# Patient Record
Sex: Female | Born: 1950 | Race: Black or African American | Hispanic: No | Marital: Single | Smoking: Current every day smoker
Health system: Southern US, Community
[De-identification: ages and names within clinical notes are randomized; demographics above are authoritative.]

## PROBLEM LIST (undated history)

## (undated) DIAGNOSIS — I255 Ischemic cardiomyopathy: Secondary | ICD-10-CM

## (undated) DIAGNOSIS — I240 Acute coronary thrombosis not resulting in myocardial infarction: Secondary | ICD-10-CM

## (undated) DIAGNOSIS — G40909 Epilepsy, unspecified, not intractable, without status epilepticus: Secondary | ICD-10-CM

## (undated) DIAGNOSIS — E7800 Pure hypercholesterolemia, unspecified: Secondary | ICD-10-CM

## (undated) DIAGNOSIS — Z720 Tobacco use: Secondary | ICD-10-CM

## (undated) DIAGNOSIS — F250 Schizoaffective disorder, bipolar type: Secondary | ICD-10-CM

## (undated) DIAGNOSIS — I10 Essential (primary) hypertension: Secondary | ICD-10-CM

## (undated) DIAGNOSIS — E119 Type 2 diabetes mellitus without complications: Secondary | ICD-10-CM

## (undated) DIAGNOSIS — R413 Other amnesia: Secondary | ICD-10-CM

## (undated) DIAGNOSIS — F259 Schizoaffective disorder, unspecified: Secondary | ICD-10-CM

---

## 2013-01-27 DIAGNOSIS — N952 Postmenopausal atrophic vaginitis: Secondary | ICD-10-CM | POA: Insufficient documentation

## 2013-04-03 ENCOUNTER — Inpatient Hospital Stay (HOSPITAL_COMMUNITY)
Admission: AD | Admit: 2013-04-03 | Discharge: 2013-04-15 | DRG: 234 | Disposition: A | Discharge status: Home / Self Care | Payer: Medicaid Other | Source: Other Acute Inpatient Hospital | Attending: Surgery | Admitting: Surgery

## 2013-04-03 ENCOUNTER — Encounter (HOSPITAL_COMMUNITY): Payer: Self-pay | Admitting: *Deleted

## 2013-04-03 ENCOUNTER — Encounter (HOSPITAL_COMMUNITY)
Admission: AD | Disposition: A | Discharge status: Home / Self Care | Payer: Self-pay | Source: Other Acute Inpatient Hospital | Attending: Cardiovascular Disease

## 2013-04-03 DIAGNOSIS — I2510 Atherosclerotic heart disease of native coronary artery without angina pectoris: Secondary | ICD-10-CM

## 2013-04-03 DIAGNOSIS — Z7982 Long term (current) use of aspirin: Secondary | ICD-10-CM

## 2013-04-03 DIAGNOSIS — E785 Hyperlipidemia, unspecified: Secondary | ICD-10-CM | POA: Diagnosis present

## 2013-04-03 DIAGNOSIS — E1165 Type 2 diabetes mellitus with hyperglycemia: Secondary | ICD-10-CM | POA: Diagnosis present

## 2013-04-03 DIAGNOSIS — Z9119 Patient's noncompliance with other medical treatment and regimen: Secondary | ICD-10-CM

## 2013-04-03 DIAGNOSIS — I2589 Other forms of chronic ischemic heart disease: Secondary | ICD-10-CM | POA: Diagnosis present

## 2013-04-03 DIAGNOSIS — Z6825 Body mass index (BMI) 25.0-25.9, adult: Secondary | ICD-10-CM

## 2013-04-03 DIAGNOSIS — G40209 Localization-related (focal) (partial) symptomatic epilepsy and epileptic syndromes with complex partial seizures, not intractable, without status epilepticus: Secondary | ICD-10-CM | POA: Diagnosis present

## 2013-04-03 DIAGNOSIS — G40909 Epilepsy, unspecified, not intractable, without status epilepticus: Secondary | ICD-10-CM | POA: Insufficient documentation

## 2013-04-03 DIAGNOSIS — I255 Ischemic cardiomyopathy: Secondary | ICD-10-CM

## 2013-04-03 DIAGNOSIS — D62 Acute posthemorrhagic anemia: Secondary | ICD-10-CM | POA: Diagnosis not present

## 2013-04-03 DIAGNOSIS — I059 Rheumatic mitral valve disease, unspecified: Secondary | ICD-10-CM

## 2013-04-03 DIAGNOSIS — F259 Schizoaffective disorder, unspecified: Secondary | ICD-10-CM | POA: Diagnosis present

## 2013-04-03 DIAGNOSIS — Z951 Presence of aortocoronary bypass graft: Secondary | ICD-10-CM

## 2013-04-03 DIAGNOSIS — Z79899 Other long term (current) drug therapy: Secondary | ICD-10-CM

## 2013-04-03 DIAGNOSIS — I214 Non-ST elevation (NSTEMI) myocardial infarction: Principal | ICD-10-CM | POA: Diagnosis present

## 2013-04-03 DIAGNOSIS — E119 Type 2 diabetes mellitus without complications: Secondary | ICD-10-CM | POA: Insufficient documentation

## 2013-04-03 DIAGNOSIS — IMO0002 Reserved for concepts with insufficient information to code with codable children: Secondary | ICD-10-CM | POA: Diagnosis present

## 2013-04-03 DIAGNOSIS — Z8249 Family history of ischemic heart disease and other diseases of the circulatory system: Secondary | ICD-10-CM

## 2013-04-03 DIAGNOSIS — E876 Hypokalemia: Secondary | ICD-10-CM | POA: Diagnosis not present

## 2013-04-03 DIAGNOSIS — Z91199 Patient's noncompliance with other medical treatment and regimen due to unspecified reason: Secondary | ICD-10-CM

## 2013-04-03 DIAGNOSIS — Z720 Tobacco use: Secondary | ICD-10-CM | POA: Insufficient documentation

## 2013-04-03 DIAGNOSIS — F17200 Nicotine dependence, unspecified, uncomplicated: Secondary | ICD-10-CM | POA: Diagnosis present

## 2013-04-03 DIAGNOSIS — F79 Unspecified intellectual disabilities: Secondary | ICD-10-CM | POA: Diagnosis present

## 2013-04-03 DIAGNOSIS — R079 Chest pain, unspecified: Secondary | ICD-10-CM

## 2013-04-03 HISTORY — DX: Acute coronary thrombosis not resulting in myocardial infarction: I24.0

## 2013-04-03 HISTORY — DX: Tobacco use: Z72.0

## 2013-04-03 HISTORY — DX: Schizoaffective disorder, bipolar type: F25.0

## 2013-04-03 HISTORY — DX: Epilepsy, unspecified, not intractable, without status epilepticus: G40.909

## 2013-04-03 HISTORY — DX: Ischemic cardiomyopathy: I25.5

## 2013-04-03 HISTORY — DX: Other amnesia: R41.3

## 2013-04-03 HISTORY — PX: CORONARY ANGIOGRAM: SHX5466

## 2013-04-03 HISTORY — DX: Schizoaffective disorder, unspecified: F25.9

## 2013-04-03 HISTORY — PX: RIGHT HEART CATHETERIZATION: SHX5447

## 2013-04-03 LAB — BASIC METABOLIC PANEL
BUN: 8 mg/dL (ref 6–23)
BUN: 6 mg/dL (ref 6–23)
CO2: 29 mEq/L (ref 19–32)
Calcium: 9.1 mg/dL (ref 8.4–10.5)
Calcium: 8.8 mg/dL (ref 8.4–10.5)
Creatinine, Ser: 0.46 mg/dL — ABNORMAL LOW (ref 0.50–1.10)
GFR calc Af Amer: >90 mL/min (ref >90)
GFR calc non Af Amer: >90 mL/min (ref >90)
GFR calc non Af Amer: >90 mL/min (ref >90)
Glucose, Bld: 364 mg/dL — ABNORMAL HIGH (ref 70–99)
Potassium: 3 mEq/L — ABNORMAL LOW (ref 3.5–5.1)
Potassium: 3.3 mEq/L — ABNORMAL LOW (ref 3.5–5.1)
Sodium: 134 mEq/L — ABNORMAL LOW (ref 135–145)

## 2013-04-03 LAB — CBC
HCT: 39.2 % (ref 36.0–46.0)
Hemoglobin: 14.1 g/dL (ref 12.0–15.0)
Hemoglobin: 13.2 g/dL (ref 12.0–15.0)
MCH: 32.8 pg (ref 26.0–34.0)
MCH: 31.1 pg (ref 26.0–34.0)
MCHC: 36 g/dL (ref 30.0–36.0)
MCV: 90.6 fL (ref 78.0–100.0)
Platelets: 225 10*3/uL (ref 150–400)
RBC: 4.25 MIL/uL (ref 3.87–5.11)
RDW: 12.4 % (ref 11.5–15.5)
RDW: 12.5 % (ref 11.5–15.5)

## 2013-04-03 LAB — POCT I-STAT 3, VENOUS BLOOD GAS (G3P V)
Acid-base deficit: 1 mmol/L (ref 0.0–2.0)
Bicarbonate: 26.2 mEq/L — ABNORMAL HIGH (ref 20.0–24.0)
O2 Saturation: 67 %
TCO2: 28 mmol/L (ref 0–100)
pCO2, Ven: 45.7 mmHg (ref 45.0–50.0)
pO2, Ven: 36 mmHg (ref 30.0–45.0)
pO2, Ven: 38 mmHg (ref 30.0–45.0)

## 2013-04-03 LAB — LIPID PANEL
Total CHOL/HDL Ratio: 8.6 RATIO
Triglycerides: 299 mg/dL — ABNORMAL HIGH (ref <150)
VLDL: 60 mg/dL — ABNORMAL HIGH (ref 0–40)

## 2013-04-03 LAB — TROPONIN I
Troponin I: 1.61 ng/mL (ref <0.30)
Troponin I: 0.97 ng/mL (ref <0.30)

## 2013-04-03 LAB — POCT ACTIVATED CLOTTING TIME: Activated Clotting Time: 135 seconds

## 2013-04-03 LAB — POCT I-STAT 3, ART BLOOD GAS (G3+)
O2 Saturation: 91 %
pO2, Arterial: 61 mmHg — ABNORMAL LOW (ref 80.0–100.0)

## 2013-04-03 LAB — GLUCOSE, CAPILLARY
Glucose-Capillary: 192 mg/dL — ABNORMAL HIGH (ref 70–99)
Glucose-Capillary: 268 mg/dL — ABNORMAL HIGH (ref 70–99)
Glucose-Capillary: 199 mg/dL — ABNORMAL HIGH (ref 70–99)

## 2013-04-03 LAB — MRSA PCR SCREENING: MRSA by PCR: NEGATIVE

## 2013-04-03 LAB — PRO B NATRIURETIC PEPTIDE: Pro B Natriuretic peptide (BNP): 2068 pg/mL — ABNORMAL HIGH (ref 0–125)

## 2013-04-03 LAB — HEMOGLOBIN A1C
Hgb A1c MFr Bld: 11.9 % — ABNORMAL HIGH (ref <5.7)
Mean Plasma Glucose: 295 mg/dL — ABNORMAL HIGH (ref <117)

## 2013-04-03 LAB — PROTIME-INR: Prothrombin Time: 13.6 seconds (ref 11.6–15.2)

## 2013-04-03 LAB — HEPARIN LEVEL (UNFRACTIONATED): Heparin Unfractionated: 0.23 IU/mL — ABNORMAL LOW (ref 0.30–0.70)

## 2013-04-03 SURGERY — CORONARY ANGIOGRAM

## 2013-04-03 MED ORDER — SODIUM CHLORIDE 0.9 % IJ SOLN
3.0000 mL | Freq: Two times a day (BID) | INTRAMUSCULAR | Status: DC
  Administered 2013-04-04 – 2013-04-06 (×5): 3 mL via INTRAVENOUS
  Administered 2013-04-07: 11:00:00 via INTRAVENOUS
  Administered 2013-04-08: 3 mL via INTRAVENOUS

## 2013-04-03 MED ORDER — HEPARIN (PORCINE) IN NACL 100-0.45 UNIT/ML-% IJ SOLN
1200.0000 [IU]/h | INTRAMUSCULAR | Status: DC
  Administered 2013-04-04: 1000 [IU]/h via INTRAVENOUS
  Administered 2013-04-04 – 2013-04-06 (×3): 1300 [IU]/h via INTRAVENOUS
  Administered 2013-04-07: 1200 [IU]/h via INTRAVENOUS
  Filled 2013-04-03 (×10): qty 250

## 2013-04-03 MED ORDER — NITROGLYCERIN 0.2 MG/ML ON CALL CATH LAB
INTRAVENOUS | Status: AC
  Filled 2013-04-03: qty 1

## 2013-04-03 MED ORDER — ENALAPRIL MALEATE 2.5 MG PO TABS
2.5000 mg | ORAL_TABLET | Freq: Two times a day (BID) | ORAL | Status: DC
  Administered 2013-04-04 (×2): 2.5 mg via ORAL
  Filled 2013-04-03 (×4): qty 1

## 2013-04-03 MED ORDER — ACETAMINOPHEN 325 MG PO TABS
650.0000 mg | ORAL_TABLET | ORAL | Status: DC | PRN
  Administered 2013-04-05 – 2013-04-07 (×3): 650 mg via ORAL
  Filled 2013-04-03 (×3): qty 2

## 2013-04-03 MED ORDER — ACETAMINOPHEN 325 MG PO TABS: 650.0000 mg | ORAL_TABLET | ORAL | Status: DC | PRN

## 2013-04-03 MED ORDER — ATORVASTATIN CALCIUM 80 MG PO TABS
80.0000 mg | ORAL_TABLET | Freq: Every day | ORAL | Status: DC
  Administered 2013-04-04 – 2013-04-13 (×9): 80 mg via ORAL
  Filled 2013-04-03 (×14): qty 1

## 2013-04-03 MED ORDER — INSULIN ASPART 100 UNIT/ML ~~LOC~~ SOLN
5.0000 [IU] | Freq: Once | SUBCUTANEOUS | Status: AC
  Administered 2013-04-03: 04:00:00 5 [IU] via SUBCUTANEOUS

## 2013-04-03 MED ORDER — SODIUM CHLORIDE 0.9 % IV SOLN
INTRAVENOUS | Status: DC
  Administered 2013-04-03 – 2013-04-04 (×2): via INTRAVENOUS
  Administered 2013-04-06: 10 mL/h via INTRAVENOUS
  Administered 2013-04-08: 10:00:00 via INTRAVENOUS
  Administered 2013-04-09: 10 mL/h via INTRAVENOUS

## 2013-04-03 MED ORDER — ONDANSETRON HCL 4 MG/2ML IJ SOLN: 4.0000 mg | Freq: Four times a day (QID) | INTRAMUSCULAR | Status: DC | PRN

## 2013-04-03 MED ORDER — SODIUM CHLORIDE 0.9 % IJ SOLN: 3.0000 mL | INTRAMUSCULAR | Status: DC | PRN

## 2013-04-03 MED ORDER — HEART ATTACK BOUNCING BOOK
Freq: Once | Status: AC
  Administered 2013-04-03: 05:00:00
  Filled 2013-04-03: qty 1

## 2013-04-03 MED ORDER — PHENYTOIN SODIUM EXTENDED 100 MG PO CAPS
100.0000 mg | ORAL_CAPSULE | Freq: Every day | ORAL | Status: DC
  Administered 2013-04-03 – 2013-04-08 (×6): 100 mg via ORAL
  Filled 2013-04-03 (×9): qty 1

## 2013-04-03 MED ORDER — POTASSIUM CHLORIDE CRYS ER 20 MEQ PO TBCR
40.0000 meq | EXTENDED_RELEASE_TABLET | Freq: Once | ORAL | Status: AC
  Administered 2013-04-03: 16:00:00 40 meq via ORAL
  Filled 2013-04-03: qty 2

## 2013-04-03 MED ORDER — SODIUM CHLORIDE 0.9 % IV SOLN: 250.0000 mL | INTRAVENOUS | Status: DC | PRN

## 2013-04-03 MED ORDER — NITROGLYCERIN 0.4 MG SL SUBL: 0.4000 mg | SUBLINGUAL_TABLET | SUBLINGUAL | Status: DC | PRN

## 2013-04-03 MED ORDER — METOPROLOL TARTRATE 12.5 MG HALF TABLET
12.5000 mg | ORAL_TABLET | Freq: Two times a day (BID) | ORAL | Status: DC
  Administered 2013-04-03: 12.5 mg via ORAL
  Filled 2013-04-03 (×3): qty 1

## 2013-04-03 MED ORDER — HEPARIN (PORCINE) IN NACL 100-0.45 UNIT/ML-% IJ SOLN
1100.0000 [IU]/h | INTRAMUSCULAR | Status: DC
  Filled 2013-04-03 (×2): qty 250

## 2013-04-03 MED ORDER — FENTANYL CITRATE 0.05 MG/ML IJ SOLN
INTRAMUSCULAR | Status: AC
  Filled 2013-04-03: qty 2

## 2013-04-03 MED ORDER — PHENYTOIN SODIUM EXTENDED 100 MG PO CAPS
100.0000 mg | ORAL_CAPSULE | Freq: Every day | ORAL | Status: DC
  Filled 2013-04-03: qty 1

## 2013-04-03 MED ORDER — POTASSIUM CHLORIDE CRYS ER 20 MEQ PO TBCR
40.0000 meq | EXTENDED_RELEASE_TABLET | Freq: Once | ORAL | Status: AC
  Administered 2013-04-03: 40 meq via ORAL
  Filled 2013-04-03: qty 2

## 2013-04-03 MED ORDER — LIDOCAINE HCL (PF) 1 % IJ SOLN
INTRAMUSCULAR | Status: AC
  Filled 2013-04-03: qty 30

## 2013-04-03 MED ORDER — SODIUM CHLORIDE 0.9 % IV SOLN
1.0000 mL/kg/h | INTRAVENOUS | Status: DC
  Administered 2013-04-03: 1 mL/kg/h via INTRAVENOUS

## 2013-04-03 MED ORDER — CARVEDILOL 3.125 MG PO TABS
3.1250 mg | ORAL_TABLET | Freq: Two times a day (BID) | ORAL | Status: DC
  Administered 2013-04-04: 3.125 mg via ORAL
  Filled 2013-04-03 (×5): qty 1

## 2013-04-03 MED ORDER — ASPIRIN EC 81 MG PO TBEC
81.0000 mg | DELAYED_RELEASE_TABLET | Freq: Every day | ORAL | Status: DC
  Administered 2013-04-04 – 2013-04-09 (×6): 81 mg via ORAL
  Filled 2013-04-03 (×7): qty 1

## 2013-04-03 MED ORDER — HEPARIN (PORCINE) IN NACL 2-0.9 UNIT/ML-% IJ SOLN
INTRAMUSCULAR | Status: AC
  Filled 2013-04-03: qty 1000

## 2013-04-03 MED ORDER — SODIUM CHLORIDE 0.9 % IV SOLN
INTRAVENOUS | Status: AC
  Administered 2013-04-03: 20:00:00 via INTRAVENOUS

## 2013-04-03 MED ORDER — MIDAZOLAM HCL 2 MG/2ML IJ SOLN
INTRAMUSCULAR | Status: AC
  Filled 2013-04-03: qty 2

## 2013-04-03 MED ORDER — HEPARIN BOLUS VIA INFUSION
1500.0000 [IU] | Freq: Once | INTRAVENOUS | Status: AC
  Administered 2013-04-03: 07:00:00 1500 [IU] via INTRAVENOUS
  Filled 2013-04-03: qty 1500

## 2013-04-03 MED ORDER — SODIUM CHLORIDE 0.9 % IJ SOLN: 3.0000 mL | Freq: Two times a day (BID) | INTRAMUSCULAR | Status: DC

## 2013-04-03 NOTE — Progress Notes (Signed)
   Patient Name: [name] [name] Date of Encounter: 04/03/2013   Principal Problem:   NSTEMI (non-ST elevated myocardial infarction) Active Problems:   T2DM (type 2 diabetes mellitus)   Dyslipidemia   Seizure disorder   SUBJECTIVE  No chest pain or sob overnight.  CURRENT MEDS . [START ON 04/04/2013] aspirin EC  81 mg Oral Daily  . atorvastatin  80 mg Oral q1800  . metoprolol tartrate  12.5 mg Oral BID  . phenytoin  100 mg Oral Daily  . sodium chloride  3 mL Intravenous Q12H    OBJECTIVE  Filed Vitals:   04/03/13 0001 04/03/13 0335 04/03/13 0442 04/03/13 0754  BP: 121/74 98/46 96/59 83/42  Pulse: 80 74 69 76  Temp: 98.2 F (36.8 C)  97.5 F (36.4 C) 97.7 F (36.5 C)  TempSrc: Oral  Oral Oral  Resp: 20  18 18  Height: 5' 1" (1.549 m)     Weight: 135 lb 2.3 oz (61.3 kg)     SpO2: 98%  96% 96%    Intake/Output Summary (Last 24 hours) at 04/03/13 0801 Last data filed at 04/03/13 0641  Gross per 24 hour  Intake    153 ml  Output    300 ml  Net   -147 ml   Filed Weights   04/03/13 0001  Weight: 135 lb 2.3 oz (61.3 kg)    PHYSICAL EXAM  General: Pleasant, NAD. Neuro: Alert and oriented X 3. Moves all extremities spontaneously. Psych: Normal affect. HEENT:  Normal  Neck: Supple without bruits or JVD. Lungs:  Resp regular and unlabored, diminished. Heart: RRR no s3, s4, or murmurs. Abdomen: Soft, non-tender, non-distended, BS + x 4.  Extremities: No clubbing, cyanosis or edema. DP/PT/Radials 2+ and equal bilaterally.  Accessory Clinical Findings  CBC  Recent Labs  04/03/13 0236 04/03/13 0510  WBC 6.4 5.3  HGB 14.1 13.2  HCT 39.2 38.5  MCV 91.2 90.6  PLT 224 225   Basic Metabolic Panel  Recent Labs  04/03/13 0236  NA 134*  K 3.0*  CL 95*  CO2 29  GLUCOSE 364*  BUN 8  CREATININE 0.46*  CALCIUM 9.1  MG 2.2   Cardiac Enzymes  Recent Labs  04/03/13 0236  TROPONINI 1.52*   Fasting Lipid Panel  Recent Labs  04/03/13 0236  CHOL  302*  HDL 35*  LDLCALC 207*  TRIG 299*  CHOLHDL 8.6   TELE  rsr  ECG  pending  Radiology/Studies  No results found.  ASSESSMENT AND PLAN  1.  NSTEMI:  Pt presented with chest pain to [place] [facility] yesterday and was found to have an elevated troponin.  She's had no chest pain overnight.  Cont asa, heparin, bb, statin, and plan cath this afternoon.  She's asked that I call her mother [name] [name], and I called her @ [phone] and discussed our plan for care.  I also called her son [name] [name] @ [phone], however this went directly to voicemail.  2.  DM:  Cont SSI.  3.  HL:  LDL 207.  Cont high potency statin.  4.  Sz d/o:  Cont dilantin.  5.  Tob Abuse:  Cessation advised.  Signed, Christopher Berge NP  Patient examined chart reviewed Pain improved Has significatn memory problems.   Cath latter today  [name]  

## 2013-04-03 NOTE — H&P (Signed)
[name] [name] is an 62 y.o. female.   Chief Complaint: Chest Pain  HPI: [name] [name] is a 62 yo woman with PMH of T2DM, epilepsy, dyslipidemia who has had intermittent chest pain over the last few weeks. She characterizes the pain as substernal, pressure-like, occasional burning, with some radiation to her abdomen, back and jaw. She has some associated SOB and cough. The pain appears to be worse with exertion. She continues to smoke. She is accompanied by her son who tells me she hasn't taken care of herself. She was transferred to [facility] after a troponin resulted at 6.9. ckmb 1.6, inr 1.0. She was given a large aspirin and started on a heparin gtt.   Past Medical History  Diagnosis Date  . Diabetes mellitus without complication   . Seizures     No past surgical history on file.  No family history on file. Social History:  reports that she has been smoking Cigarettes.  She has been smoking about 2.00 packs per day. She does not have any smokeless tobacco history on file. Her alcohol and drug histories are not on file.  Allergies: No Known Allergies  No prescriptions prior to admission  no known family history of CAD  No results found for this or any previous visit (from the past 48 hour(s)). No results found.  Review of Systems  Constitutional: Positive for malaise/fatigue. Negative for fever and chills.  HENT: Negative for ear discharge and tinnitus.   Eyes: Negative for blurred vision and photophobia.  Respiratory: Positive for cough and shortness of breath.   Cardiovascular: Positive for chest pain. Negative for orthopnea.  Gastrointestinal: Negative for nausea and vomiting.  Genitourinary: Negative for dysuria and frequency.  Musculoskeletal: Negative for myalgias and neck pain.  Skin: Negative for rash.  Neurological: Positive for weakness. Negative for tingling and tremors.  Psychiatric/Behavioral: Negative for depression and suicidal ideas.    Blood pressure 121/74,  pulse 80, temperature 98.2 F (36.8 C), temperature source Oral, resp. rate 20, height 5\' 1"  (1.549 m), weight 61.3 kg (135 lb 2.3 oz), SpO2 98.00%. Physical Exam  Nursing note and vitals reviewed. Constitutional: She is oriented to person, place, and time. She appears well-developed and well-nourished. No distress.  HENT:  Head: Normocephalic and atraumatic.  Nose: Nose normal.  Mouth/Throat: Oropharynx is clear and moist. No oropharyngeal exudate.  Eyes: Conjunctivae and EOM are normal. Pupils are equal, round, and reactive to light. No scleral icterus.  Neck: Normal range of motion. Neck supple. No JVD present. No thyromegaly present.  Cardiovascular: Normal rate, regular rhythm, normal heart sounds and intact distal pulses.  Exam reveals no gallop.   No murmur heard. Respiratory: Effort normal and breath sounds normal. No respiratory distress. She has no wheezes.  GI: Soft. Bowel sounds are normal. She exhibits no distension. There is no tenderness.  Musculoskeletal: Normal range of motion. She exhibits no edema.  Neurological: She is alert and oriented to person, place, and time. No cranial nerve deficit.  Skin: Skin is warm and dry. No rash noted. She is not diaphoretic. No erythema.  Psychiatric: She has a normal mood and affect. Her behavior is normal. Thought content normal.   labs reviewed in OSH chart, Trop I 6.9, inr 1.0, CKMB 1.6, h/h 15/42, plt 277, na 133, K 3.4, cr 0.8 ECG reviewed; sinus tach, lateral ST depression, q wave in III/aVF, PVC  Problem List Chest Pain/NSTEMI Dyslipidemia T2DM Epilepsy Tobacco abuse  Assessment/Plan 62 yo woman with PMH of dyslipidemia, T2DM,  epilepsy and tobacco use here with intermittent chest pain for a few weeks found to have elevated troponin but low CKMB. She's had an NSTEMI but could be more remote. She's on aspirin/heparin gtt, telemetry. Will obtain echo, make NPO and plan for potential LHC in AM. I discussed the plan with Ms.  [name] and her son.  - telemetry, aspirin, heparin gtt - BNP, TSH, lipid panel, hba1c - atorvastatin, low dose metoprolol - holding metformin - echocardiogram in AM - continue epilepsy medications - 100 mg dilantin daily - NPO for potential LHC in AM  [name] [name] 04/03/2013, 1:49 AM

## 2013-04-03 NOTE — CV Procedure (Signed)
Cardiac Cath Procedure Note  Indication: NSTEMI, severe LV dysfunction  Procedures performed:  1) Right heart cathererization 2) Selective coronary angiography 3) L subclavian angiography   Description of procedure:     The risks and indication of the procedure were explained. Consent was signed and placed on the chart. An appropriate timeout was taken prior to the procedure. The right groin was prepped and draped in the routine sterile fashion and anesthetized with 1% local lidocaine.   A 5 FR arterial sheath was placed in the right femoral artery using a modified Seldinger technique. Standard catheters including a JL4 and JR4.were used. All catheter exchanges were made over a wire. We did not cross the aortic valve due to the presence of an LV apical thrombus on echo. A 7 FR venous sheath was placed in the right femoral vein using a modified Seldinger technique. A standard Swan-Ganz catheter was used for the procedure.   Total contrast: 40cc  Complications:  None apparent  Findings:  RA = 6 RV = 29/4/6 PA = 28/10 (18) PCW = 10 Fick cardiac output/index =5.2/3.2 PVR = 1.5 WU SVR = 1180 FA sat = 91% PA sat = 67%, 69%  Ao Pressure: 107/64 (82)  Left main: Calcified. Mild to moderate diffuse narrowing probably in the 40% range  LAD: Heavily calcified with diffuse disease throughout. 90-95 ostial stenosis. Proximal to mid long 60% stenosis followed by 90% stenosis at the take-off of D2. 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 Diffusely calcified. Gives off large OM-1, Small OM-2. Moderate sized OM-3. Small PDA. LCX has 70-80% proximal lesion at take-off of OM-1. Diffuse 50-60% in mid vessel followed by 70-80% lesion prior to take-off of OM-2. Distal vessel has severe diffuse disease. OM-1 long 50-60% proximally.   RCA: Small co-dominant vessel. 95% ostial lesion with catheter damping. Diffuse 90% lesion in mid to distal vessel.  Vessel is not graftable distally.  L subclavian: Widely patent   Assessment:  1. Severe 3v CAD 2. Severe LV dysfunction with apical clot 3. Well preserved hemodynamics 4. Patent L subclavian  Plan/Discussion:  She has severe CAD with diabetic vasculopathy and markedly decreased LV function. Will have TCTS see. With high grade ostial LAD lesion would resume heparin 8 hours after cath.    Arvilla Meres, MD 5:15 PM

## 2013-04-03 NOTE — Progress Notes (Signed)
Lab results called to Dr Leeann Must, orders received.  Critical Troponin 1.25 but down from previous 6.897 at [facility].  K: 3.0, Glu 364, BNP 2068.  Pt dozing quietly, awakens and follows commands, denies complaints.  BP 98/46, Metoprolol held, PO KCl and [place] insulin given.

## 2013-04-03 NOTE — Progress Notes (Signed)
Inpatient Diabetes Program Recommendations  AACE/ADA: New Consensus Statement on Inpatient Glycemic Control (2013)  Target Ranges:  Prepandial:   less than 140 mg/dL      Peak postprandial:   less than 180 mg/dL (1-2 hours)      Critically ill patients:  140 - 180 mg/dL   Reason for Visit: Results for CAYDANCE, KUEHNLE (MRN [id number]) as of 04/03/2013 13:21  Ref. Range 04/03/2013 02:36 04/03/2013 12:10  Glucose Latest Range: 70-99 mg/dL 782 (H) 956 (H)   Please add Novolog correction moderate tid with meals.  A1C pending.    Beryl Meager, RN, BC-ADM Inpatient Diabetes Coordinator Pager [phone]

## 2013-04-03 NOTE — Progress Notes (Signed)
  Echocardiogram 2D Echocardiogram has been performed.  [name] [name] 04/03/2013, 4:02 PM

## 2013-04-03 NOTE — Plan of Care (Signed)
Problem: Phase I Progression Outcomes Goal: Aspirin unless contraindicated Outcome: Completed/Met Date Met:  04/03/13 Received 325 mg ASA at [facility] ED at 2025

## 2013-04-03 NOTE — Progress Notes (Addendum)
ANTICOAGULATION CONSULT NOTE - Initial Consult  Pharmacy Consult for Heparin  Indication: chest pain/ACS  No Known Allergies  Patient Measurements: Height: 5\' 1"  (154.9 cm) Weight: 135 lb 2.3 oz (61.3 kg) IBW/kg (Calculated) : 47.8  Vital Signs: Temp: 98.2 F (36.8 C) (11/14 0001) Temp src: Oral (11/14 0001) BP: 121/74 mmHg (11/14 0001) Pulse Rate: 80 (11/14 0001)  Labs from outside hospital:  Scr 0.8 H/H 15.2/44.4 Plts 277 INR 1.03  Medical History: Past Medical History  Diagnosis Date  . Diabetes mellitus without complication   . Seizures    Assessment: 62 y/o F from [place] on heparin drip. Heparin 4000 units BOLUS and drip at 800 units/hr was started at ~2200 on 11/13.   Goal of Therapy:  Heparin level 0.3-0.7 units/ml Monitor platelets by anticoagulation protocol: Yes   Plan:  -Continue heparin at 800 units/hr  -Check first HL with AM labs -Daily CBC/HL -Monitor for bleeding  Thank you for allowing me to take part in this patient's care,  Abran Duke, PharmD Clinical Pharmacist Phone: [phone] Pager: [phone] 04/03/2013 2:31 AM  Addendum 6:13 AM First HL is 0.14 on 800 units/hr of heparin  Plan -Heparin 1500 units BOLUS x 1 -Increase heparin drip 1000 units/hr  -6 hour HL at 1300 -Daily CBC/HL -F/U cardiology plans  Wilmer Floor, PharmD

## 2013-04-03 NOTE — H&P (View-Only) (Signed)
   Patient Name: [name] [name] Date of Encounter: 04/03/2013   Principal Problem:   NSTEMI (non-ST elevated myocardial infarction) Active Problems:   T2DM (type 2 diabetes mellitus)   Dyslipidemia   Seizure disorder   SUBJECTIVE  No chest pain or sob overnight.  CURRENT MEDS . [START ON 04/04/2013] aspirin EC  81 mg Oral Daily  . atorvastatin  80 mg Oral q1800  . metoprolol tartrate  12.5 mg Oral BID  . phenytoin  100 mg Oral Daily  . sodium chloride  3 mL Intravenous Q12H    OBJECTIVE  Filed Vitals:   04/03/13 0001 04/03/13 0335 04/03/13 0442 04/03/13 0754  BP: 121/74 98/46 96/59  83/42  Pulse: 80 74 69 76  Temp: 98.2 F (36.8 C)  97.5 F (36.4 C) 97.7 F (36.5 C)  TempSrc: Oral  Oral Oral  Resp: 20  18 18   Height: 5\' 1"  (1.549 m)     Weight: 135 lb 2.3 oz (61.3 kg)     SpO2: 98%  96% 96%    Intake/Output Summary (Last 24 hours) at 04/03/13 0801 Last data filed at 04/03/13 0641  Gross per 24 hour  Intake    153 ml  Output    300 ml  Net   -147 ml   Filed Weights   04/03/13 0001  Weight: 135 lb 2.3 oz (61.3 kg)    PHYSICAL EXAM  General: Pleasant, NAD. Neuro: Alert and oriented X 3. Moves all extremities spontaneously. Psych: Normal affect. HEENT:  Normal  Neck: Supple without bruits or JVD. Lungs:  Resp regular and unlabored, diminished. Heart: RRR no s3, s4, or murmurs. Abdomen: Soft, non-tender, non-distended, BS + x 4.  Extremities: No clubbing, cyanosis or edema. DP/PT/Radials 2+ and equal bilaterally.  Accessory Clinical Findings  CBC  Recent Labs  04/03/13 0236 04/03/13 0510  WBC 6.4 5.3  HGB 14.1 13.2  HCT 39.2 38.5  MCV 91.2 90.6  PLT 224 225   Basic Metabolic Panel  Recent Labs  04/03/13 0236  NA 134*  K 3.0*  CL 95*  CO2 29  GLUCOSE 364*  BUN 8  CREATININE 0.46*  CALCIUM 9.1  MG 2.2   Cardiac Enzymes  Recent Labs  04/03/13 0236  TROPONINI 1.52*   Fasting Lipid Panel  Recent Labs  04/03/13 0236  CHOL  302*  HDL 35*  LDLCALC 207*  TRIG 299*  CHOLHDL 8.6   TELE  rsr  ECG  pending  Radiology/Studies  No results found.  ASSESSMENT AND PLAN  1.  NSTEMI:  Pt presented with chest pain to [facility] yesterday and was found to have an elevated troponin.  She's had no chest pain overnight.  Cont asa, heparin, bb, statin, and plan cath this afternoon.  She's asked that I call her mother [name], and I called her @ [phone] and discussed our plan for care.  I also called her son [name] @ [phone], however this went directly to voicemail.  2.  DM:  Cont SSI.  3.  HL:  LDL 207.  Cont high potency statin.  4.  Sz d/o:  Cont dilantin.  5.  Tob Abuse:  Cessation advised.  Signed, Nicolasa Ducking NP  Patient examined chart reviewed Pain improved Has significatn memory problems.   Cath latter today  [name]

## 2013-04-03 NOTE — Progress Notes (Signed)
Pt received via EMS from [facility] ED, denies c/o CP.  Dr Tresa Endo informed, advised she could eat something then be NPO and he will see her and write orders.  Son here, discussed plan.   Pt very poor historian, obtained as much of history from son as possible.  Tele SR 70-80, vss.  Call light in reach, safety video and cardiac cath video shown to son, pt not interested.  Pt oriented to person/place.  Bed exit alarm on.

## 2013-04-03 NOTE — Interval H&P Note (Signed)
History and Physical Interval Note:  04/03/2013 4:50 PM  [name] [name]  has presented today for surgery, with the diagnosis of NSTEMI The various methods of treatment have been discussed with the patient and family. After consideration of risks, benefits and other options for treatment, the patient has consented to  Procedure(s): LEFT HEART CATHETERIZATION WITH CORONARY ANGIOGRAM (N/A)  And RIGHT HEART CATH Cath Lab Visit (complete for each Cath Lab visit)  Clinical Evaluation Leading to the Procedure:   ACS: yes  Non-ACS:    Anginal Classification: CCS IV  Anti-ischemic medical therapy: Maximal Therapy (2 or more classes of medications)  Non-Invasive Test Results: No non-invasive testing performed  Prior CABG: No previous CABG      as a surgical intervention .  The patient's history has been reviewed, patient examined, no change in status, stable for surgery.  I have reviewed the patient's chart and labs.  Questions were answered to the patient's satisfaction.     Daniel Bensimhon

## 2013-04-03 NOTE — Progress Notes (Signed)
ANTICOAGULATION CONSULT NOTE - Follow Up Consult  Pharmacy Consult for heparin Indication:  ACS / 3V CAD / LV thrombus  No Known Allergies  Patient Measurements: Height: 5\' 1"  (154.9 cm) Weight: 135 lb 2.3 oz (61.3 kg) IBW/kg (Calculated) : 47.8 Heparin Dosing Weight: 60 kg  Vital Signs: BP: 100/62 mmHg (11/14 1930) Pulse Rate: 69 (11/14 1930)  Labs:  Recent Labs  04/03/13 0236 04/03/13 0510 04/03/13 0803 04/03/13 1210 04/03/13 1404  HGB 14.1 13.2  --   --   --   HCT 39.2 38.5  --   --   --   PLT 224 225  --   --   --   LABPROT 13.6  --   --   --   --   INR 1.06  --   --   --   --   HEPARINUNFRC  --  0.14*  --  0.23*  --   CREATININE 0.46*  --   --  0.40*  --   TROPONINI 1.52*  --  1.61*  --  0.97*    Estimated Creatinine Clearance: 62.8 ml/min (by C-G formula based on Cr of 0.4).   Medications:  Scheduled:  . [START ON 04/04/2013] aspirin EC  81 mg Oral Daily  . atorvastatin  80 mg Oral q1800  . [START ON 04/04/2013] carvedilol  3.125 mg Oral BID WC  . [START ON 04/04/2013] enalapril  2.5 mg Oral BID  . phenytoin  100 mg Oral QHS  . sodium chloride  3 mL Intravenous Q12H   Infusions:  . sodium chloride 20 mL/hr at 04/03/13 0338  . sodium chloride    . heparin 1,100 Units/hr (04/03/13 1310)    Assessment: 62 yo female with chest pain  Started on heparin at outside hospital. ECHO EF 25%, LV thrombus, s/p  cath this afternoon - 3V CAD with palnned TCTS consult.  Heparin to be restarted 8hr after sheath removal - sheath out 1800 restart heparin 0200 drip rate 1000 uts/hr.   Goal of Therapy:  Heparin level 0.3-0.7 units/ml Monitor platelets by anticoagulation protocol: Yes   Plan:  Restart heparin drip 1000 uts/hr  Check HL  6hr after restart Daily HL, CBC   Leota Sauers Pharm.D. CPP, BCPS Clinical Pharmacist [phone] 04/03/2013 8:15 PM

## 2013-04-03 NOTE — Progress Notes (Addendum)
ANTICOAGULATION CONSULT NOTE - Follow Up Consult  Pharmacy Consult for heparin Indication: chest pain/ACS  No Known Allergies  Patient Measurements: Height: 5\' 1"  (154.9 cm) Weight: 135 lb 2.3 oz (61.3 kg) IBW/kg (Calculated) : 47.8 Heparin Dosing Weight: 60 kg  Vital Signs: Temp: 97.7 F (36.5 C) (11/14 0754) Temp src: Oral (11/14 0754) BP: 100/50 mmHg (11/14 1225) Pulse Rate: 73 (11/14 1225)  Labs:  Recent Labs  04/03/13 0236 04/03/13 0510 04/03/13 0803 04/03/13 1210  HGB 14.1 13.2  --   --   HCT 39.2 38.5  --   --   PLT 224 225  --   --   LABPROT 13.6  --   --   --   INR 1.06  --   --   --   HEPARINUNFRC  --  0.14*  --  0.23*  CREATININE 0.46*  --   --   --   TROPONINI 1.52*  --  1.61*  --     Estimated Creatinine Clearance: 62.8 ml/min (by C-G formula based on Cr of 0.46).   Medications:  Scheduled:  . [START ON 04/04/2013] aspirin EC  81 mg Oral Daily  . atorvastatin  80 mg Oral q1800  . metoprolol tartrate  12.5 mg Oral BID  . phenytoin  100 mg Oral QHS  . sodium chloride  3 mL Intravenous Q12H  . sodium chloride  3 mL Intravenous Q12H   Infusions:  . sodium chloride 20 mL/hr at 04/03/13 0338  . sodium chloride 1 mL/kg/hr (04/03/13 1041)  . heparin 1,000 Units/hr (04/03/13 2956)    Assessment: 62 yo female with chest pain is currently on subtherapeutic heparin.  Heparin level is 0.23.  Will go to cath this afternoon. Per RN, no problem with infusion. Goal of Therapy:  Heparin level 0.3-0.7 units/ml Monitor platelets by anticoagulation protocol: Yes   Plan:  1) Increase heparin to 1100 units/hr.   2) Check a 6hr heparin after drip is change or f/u after cath.  [name] [name], Tsz-Yin 04/03/2013,12:59 PM

## 2013-04-04 DIAGNOSIS — I214 Non-ST elevation (NSTEMI) myocardial infarction: Secondary | ICD-10-CM

## 2013-04-04 DIAGNOSIS — I2589 Other forms of chronic ischemic heart disease: Secondary | ICD-10-CM

## 2013-04-04 LAB — CBC
MCH: 32.4 pg (ref 26.0–34.0)
Platelets: 244 10*3/uL (ref 150–400)
RBC: 4.38 MIL/uL (ref 3.87–5.11)
WBC: 6 10*3/uL (ref 4.0–10.5)

## 2013-04-04 LAB — GLUCOSE, CAPILLARY
Glucose-Capillary: 233 mg/dL — ABNORMAL HIGH (ref 70–99)
Glucose-Capillary: 209 mg/dL — ABNORMAL HIGH (ref 70–99)
Glucose-Capillary: 182 mg/dL — ABNORMAL HIGH (ref 70–99)

## 2013-04-04 LAB — BASIC METABOLIC PANEL
BUN: 5 mg/dL — ABNORMAL LOW (ref 6–23)
Chloride: 102 mEq/L (ref 96–112)
GFR calc Af Amer: >90 mL/min (ref >90)
GFR calc non Af Amer: >90 mL/min (ref >90)
Glucose, Bld: 246 mg/dL — ABNORMAL HIGH (ref 70–99)
Potassium: 4 mEq/L (ref 3.5–5.1)
Sodium: 136 mEq/L (ref 135–145)

## 2013-04-04 LAB — HEPARIN LEVEL (UNFRACTIONATED): Heparin Unfractionated: <0.1 IU/mL — ABNORMAL LOW (ref 0.30–0.70)

## 2013-04-04 MED ORDER — INSULIN ASPART 100 UNIT/ML ~~LOC~~ SOLN
0.0000 [IU] | Freq: Three times a day (TID) | SUBCUTANEOUS | Status: DC
  Administered 2013-04-04 (×3): 5 [IU] via SUBCUTANEOUS
  Administered 2013-04-05: 8 [IU] via SUBCUTANEOUS
  Administered 2013-04-05 (×2): 5 [IU] via SUBCUTANEOUS
  Administered 2013-04-06: 8 [IU] via SUBCUTANEOUS
  Administered 2013-04-06 – 2013-04-07 (×4): 3 [IU] via SUBCUTANEOUS
  Administered 2013-04-07 – 2013-04-08 (×3): 5 [IU] via SUBCUTANEOUS
  Administered 2013-04-08: 2 [IU] via SUBCUTANEOUS
  Administered 2013-04-09 (×3): 3 [IU] via SUBCUTANEOUS

## 2013-04-04 MED ORDER — SPIRONOLACTONE 12.5 MG HALF TABLET
12.5000 mg | ORAL_TABLET | Freq: Every day | ORAL | Status: DC
  Administered 2013-04-04 – 2013-04-09 (×6): 12.5 mg via ORAL
  Filled 2013-04-04 (×7): qty 1

## 2013-04-04 MED ORDER — INSULIN ASPART 100 UNIT/ML ~~LOC~~ SOLN: 0.0000 [IU] | Freq: Every day | SUBCUTANEOUS | Status: DC

## 2013-04-04 NOTE — Progress Notes (Signed)
ANTICOAGULATION CONSULT NOTE - Follow Up Consult  Pharmacy Consult for heparin Indication:  ACS / 3V CAD / LV thrombus  No Known Allergies  Patient Measurements: Height: 5\' 3"  (160 cm) Weight: 139 lb 15.9 oz (63.5 kg) IBW/kg (Calculated) : 52.4 Heparin Dosing Weight: 60 kg  Vital Signs: Temp: 97.9 F (36.6 C) (11/15 0728) Temp src: Oral (11/15 0728) BP: 108/63 mmHg (11/15 0728) Pulse Rate: 77 (11/15 0728)  Labs:  Recent Labs  04/03/13 0236 04/03/13 0510 04/03/13 0803 04/03/13 1210 04/03/13 1404 04/04/13 0526 04/04/13 0925  HGB 14.1 13.2  --   --   --  14.2  --   HCT 39.2 38.5  --   --   --  40.6  --   PLT 224 225  --   --   --  244  --   LABPROT 13.6  --   --   --   --   --   --   INR 1.06  --   --   --   --   --   --   HEPARINUNFRC  --  0.14*  --  0.23*  --   --  <0.10*  CREATININE 0.46*  --   --  0.40*  --   --  0.43*  TROPONINI 1.52*  --  1.61*  --  0.97*  --   --     Estimated Creatinine Clearance: 67.1 ml/min (by C-G formula based on Cr of 0.43).   Medications:  Scheduled:  . aspirin EC  81 mg Oral Daily  . atorvastatin  80 mg Oral q1800  . carvedilol  3.125 mg Oral BID WC  . enalapril  2.5 mg Oral BID  . insulin aspart  0-15 Units Subcutaneous TID WC  . insulin aspart  0-5 Units Subcutaneous QHS  . phenytoin  100 mg Oral QHS  . sodium chloride  3 mL Intravenous Q12H  . spironolactone  12.5 mg Oral Daily   Infusions:  . sodium chloride 10 mL/hr at 04/04/13 0209  . heparin 1,000 Units/hr (04/04/13 9604)    Assessment: 62 yo [name] [name] with chest pain  Started on heparin at outside hospital. ECHO EF 25%, LV thrombus, s/p  cath this afternoon - 3V CAD with palnned TCTS consult.  Heparin to be restarted 8hr after sheath removal - sheath out 1800 restart heparin 0200 drip rate 1000 uts/hr. 6hr HL < 0.1 no problems with IV line, no bleeding per RN  Goal of Therapy:  Heparin level 0.3-0.7 units/ml Monitor platelets by anticoagulation protocol: Yes   Plan:   Increase heparin drip 1200 uts/hr  - bolus s/p cath last pm Check HL  6hr after increase Daily HL, CBC   Leota Sauers Pharm.D. CPP, BCPS Clinical Pharmacist [phone] 04/04/2013 11:14 AM

## 2013-04-04 NOTE — Consult Note (Signed)
Cardiothoracic Surgery Consultation:  Reason for Consult: Severe multi-vessel coronary artery disease and acute NSTEMI Referring Physician:  Dr. Arvilla Meres  [name] [name] is an 62 y.o. female.  HPI:   The patient is a 62 year old black female heavy smoker with type 2 DM that is poorly controlled with a Hgb A1C of 11.9 now who presented with complaints of pain in her jaw radiating to her chest and arms. She is a difficult historian and according to her family has some mental illness. She says she has had these symptoms for several months and they have been getting worse. She ruled in for MI with a troponin of 1.52, 1.61, 0.97. Cardiac cath shows severe multi-vessel coronary disease. EF 25% by echo with apical clot. She denies symptoms since admission.  Past Medical History  Diagnosis Date  . Diabetes mellitus without complication   . Seizure disorder   . Tobacco abuse     No past surgical history on file.  Family hx of heart disease  Social History:  reports that she has been smoking Cigarettes.  She has been smoking about 2.00 packs per day. She does not have any smokeless tobacco history on file. Her alcohol and drug histories are not on file.  Allergies: No Known Allergies  Medications:  I have reviewed the patient's current medications. Prior to Admission:  Prescriptions prior to admission  Medication Sig Dispense Refill  . glyBURIDE-metformin (GLUCOVANCE) 5-500 MG per tablet Take 1 tablet by mouth 2 (two) times daily with a meal.      . phenytoin (DILANTIN) 100 MG ER capsule Take 300 mg by mouth at bedtime.      . simvastatin (ZOCOR) 40 MG tablet Take 40 mg by mouth daily.       Scheduled: . aspirin EC  81 mg Oral Daily  . atorvastatin  80 mg Oral q1800  . carvedilol  3.125 mg Oral BID WC  . enalapril  2.5 mg Oral BID  . insulin aspart  0-15 Units Subcutaneous TID WC  . insulin aspart  0-5 Units Subcutaneous QHS  . phenytoin  100 mg Oral QHS  . sodium chloride  3  mL Intravenous Q12H  . spironolactone  12.5 mg Oral Daily   Continuous: . sodium chloride 10 mL/hr at 04/04/13 0209  . heparin 1,200 Units/hr (04/04/13 1103)   FAO:ZHYQMVHQIONGE, nitroGLYCERIN, ondansetron (ZOFRAN) IV Anti-infectives   None      Results for orders placed during the hospital encounter of 04/03/13 (from the past 48 hour(s))  MRSA PCR SCREENING     Status: None   Collection Time    04/03/13 12:51 AM      Result Value Range   MRSA by PCR NEGATIVE  NEGATIVE   Comment:            The GeneXpert MRSA Assay (FDA     approved for NASAL specimens     only), is one component of a     comprehensive MRSA colonization     surveillance program. It is not     intended to diagnose MRSA     infection nor to guide or     monitor treatment for     MRSA infections.  TROPONIN I     Status: Abnormal   Collection Time    04/03/13  2:36 AM      Result Value Range   Troponin I 1.52 (*) <0.30 ng/mL   Comment:  Due to the release kinetics of cTnI,     a negative result within the first hours     of the onset of symptoms does not rule out     myocardial infarction with certainty.     If myocardial infarction is still suspected,     repeat the test at appropriate intervals.     REPEATED TO VERIFY     CRITICAL RESULT CALLED TO, READ BACK BY AND VERIFIED WITH:     [name] Reveal RN [phone] EBANKS COLCLOUGH, S  HEMOGLOBIN A1C     Status: Abnormal   Collection Time    04/03/13  2:36 AM      Result Value Range   Hemoglobin A1C 11.9 (*) <5.7 %   Comment: (NOTE)                                                                               According to the ADA Clinical Practice Recommendations for 2011, when     HbA1c is used as a screening test:      >=6.5%   Diagnostic of Diabetes Mellitus               (if abnormal result is confirmed)     5.7-6.4%   Increased risk of developing Diabetes Mellitus     References:Diagnosis and Classification of Diabetes Mellitus,Diabetes      Care,2011,34(Suppl 1):S62-S69 and Standards of Medical Care in             Diabetes - 2011,Diabetes Care,2011,34 (Suppl 1):S11-S61.   Mean Plasma Glucose 295 (*) <117 mg/dL   Comment: Performed at [facility]  PRO B NATRIURETIC PEPTIDE     Status: Abnormal   Collection Time    04/03/13  2:36 AM      Result Value Range   Pro B Natriuretic peptide (BNP) 2068.0 (*) 0 - 125 pg/mL  MAGNESIUM     Status: None   Collection Time    04/03/13  2:36 AM      Result Value Range   Magnesium 2.2  1.5 - 2.5 mg/dL  TSH     Status: None   Collection Time    04/03/13  2:36 AM      Result Value Range   TSH 0.614  0.350 - 4.500 uIU/mL   Comment: Performed at [facility]  BASIC METABOLIC PANEL     Status: Abnormal   Collection Time    04/03/13  2:36 AM      Result Value Range   Sodium 134 (*) 135 - 145 mEq/L   Potassium 3.0 (*) 3.5 - 5.1 mEq/L   Chloride 95 (*) 96 - 112 mEq/L   CO2 29  19 - 32 mEq/L   Glucose, Bld 364 (*) 70 - 99 mg/dL   BUN 8  6 - 23 mg/dL   Creatinine, Ser 0.45 (*) 0.50 - 1.10 mg/dL   Calcium 9.1  8.4 - 40.9 mg/dL   GFR calc non Af Amer >90  >90 mL/min   GFR calc Af Amer >90  >90 mL/min   Comment: (NOTE)     The eGFR has been calculated using the CKD EPI equation.  This calculation has not been validated in all clinical situations.     eGFR's persistently <90 mL/min signify possible Chronic Kidney     Disease.  CBC     Status: None   Collection Time    04/03/13  2:36 AM      Result Value Range   WBC 6.4  4.0 - 10.5 K/uL   RBC 4.30  3.87 - 5.11 MIL/uL   Hemoglobin 14.1  12.0 - 15.0 g/dL   HCT 16.1  09.6 - 04.5 %   MCV 91.2  78.0 - 100.0 fL   MCH 32.8  26.0 - 34.0 pg   MCHC 36.0  30.0 - 36.0 g/dL   RDW 40.9  81.1 - 91.4 %   Platelets 224  150 - 400 K/uL  PROTIME-INR     Status: None   Collection Time    04/03/13  2:36 AM      Result Value Range   Prothrombin Time 13.6  11.6 - 15.2 seconds   INR 1.06  0.00 - 1.49  LIPID PANEL     Status:  Abnormal   Collection Time    04/03/13  2:36 AM      Result Value Range   Cholesterol 302 (*) 0 - 200 mg/dL   Triglycerides 782 (*) <150 mg/dL   HDL 35 (*) >95 mg/dL   Total CHOL/HDL Ratio 8.6     VLDL 60 (*) 0 - 40 mg/dL   LDL Cholesterol 621 (*) 0 - 99 mg/dL   Comment:            Total Cholesterol/HDL:CHD Risk     Coronary Heart Disease Risk Table                         Men   Women      1/2 Average Risk   3.4   3.3      Average Risk       5.0   4.4      2 X Average Risk   9.6   7.1      3 X Average Risk  23.4   11.0                Use the calculated Patient Ratio     above and the CHD Risk Table     to determine the patient's CHD Risk.                ATP III CLASSIFICATION (LDL):      <100     mg/dL   Optimal      308-657  mg/dL   Near or Above                        Optimal      130-159  mg/dL   Borderline      846-962  mg/dL   High      >952     mg/dL   Very High  HEPARIN LEVEL (UNFRACTIONATED)     Status: Abnormal   Collection Time    04/03/13  5:10 AM      Result Value Range   Heparin Unfractionated 0.14 (*) 0.30 - 0.70 IU/mL   Comment:            IF HEPARIN RESULTS ARE BELOW     EXPECTED VALUES, AND PATIENT     DOSAGE HAS BEEN CONFIRMED,     SUGGEST FOLLOW  UP TESTING     OF ANTITHROMBIN III LEVELS.  CBC     Status: None   Collection Time    04/03/13  5:10 AM      Result Value Range   WBC 5.3  4.0 - 10.5 K/uL   RBC 4.25  3.87 - 5.11 MIL/uL   Hemoglobin 13.2  12.0 - 15.0 g/dL   HCT 63.8  75.6 - 43.3 %   MCV 90.6  78.0 - 100.0 fL   MCH 31.1  26.0 - 34.0 pg   MCHC 34.3  30.0 - 36.0 g/dL   RDW 29.5  18.8 - 41.6 %   Platelets 225  150 - 400 K/uL  TROPONIN I     Status: Abnormal   Collection Time    04/03/13  8:03 AM      Result Value Range   Troponin I 1.61 (*) <0.30 ng/mL   Comment:            Due to the release kinetics of cTnI,     a negative result within the first hours     of the onset of symptoms does not rule out     myocardial infarction with  certainty.     If myocardial infarction is still suspected,     repeat the test at appropriate intervals.     REPEATED TO VERIFY     CRITICAL VALUE NOTED.  VALUE IS CONSISTENT WITH PREVIOUSLY REPORTED AND CALLED VALUE.  GLUCOSE, CAPILLARY     Status: Abnormal   Collection Time    04/03/13  8:20 AM      Result Value Range   Glucose-Capillary 192 (*) 70 - 99 mg/dL   Comment 1 Notify RN    HEPARIN LEVEL (UNFRACTIONATED)     Status: Abnormal   Collection Time    04/03/13 12:10 PM      Result Value Range   Heparin Unfractionated 0.23 (*) 0.30 - 0.70 IU/mL   Comment:            IF HEPARIN RESULTS ARE BELOW     EXPECTED VALUES, AND PATIENT     DOSAGE HAS BEEN CONFIRMED,     SUGGEST FOLLOW UP TESTING     OF ANTITHROMBIN III LEVELS.  BASIC METABOLIC PANEL     Status: Abnormal   Collection Time    04/03/13 12:10 PM      Result Value Range   Sodium 134 (*) 135 - 145 mEq/L   Potassium 3.3 (*) 3.5 - 5.1 mEq/L   Chloride 96  96 - 112 mEq/L   CO2 27  19 - 32 mEq/L   Glucose, Bld 277 (*) 70 - 99 mg/dL   BUN 6  6 - 23 mg/dL   Creatinine, Ser 6.06 (*) 0.50 - 1.10 mg/dL   Calcium 8.8  8.4 - 30.1 mg/dL   GFR calc non Af Amer >90  >90 mL/min   GFR calc Af Amer >90  >90 mL/min   Comment: (NOTE)     The eGFR has been calculated using the CKD EPI equation.     This calculation has not been validated in all clinical situations.     eGFR's persistently <90 mL/min signify possible Chronic Kidney     Disease.  GLUCOSE, CAPILLARY     Status: Abnormal   Collection Time    04/03/13 12:33 PM      Result Value Range   Glucose-Capillary 268 (*) 70 - 99 mg/dL  TROPONIN I  Status: Abnormal   Collection Time    04/03/13  2:04 PM      Result Value Range   Troponin I 0.97 (*) <0.30 ng/mL   Comment:            Due to the release kinetics of cTnI,     a negative result within the first hours     of the onset of symptoms does not rule out     myocardial infarction with certainty.     If myocardial  infarction is still suspected,     repeat the test at appropriate intervals.     REPEATED TO VERIFY     CRITICAL VALUE NOTED.  VALUE IS CONSISTENT WITH PREVIOUSLY REPORTED AND CALLED VALUE.  POCT I-STAT 3, BLOOD GAS (G3+)     Status: Abnormal   Collection Time    04/03/13  5:04 PM      Result Value Range   pH, Arterial 7.412  7.350 - 7.450   pCO2 arterial 38.8  35.0 - 45.0 mmHg   pO2, Arterial 61.0 (*) 80.0 - 100.0 mmHg   Bicarbonate 24.7 (*) 20.0 - 24.0 mEq/L   TCO2 26  0 - 100 mmol/L   O2 Saturation 91.0     Sample type ARTERIAL    POCT I-STAT 3, BLOOD GAS (G3P V)     Status: Abnormal   Collection Time    04/03/13  5:16 PM      Result Value Range   pH, Ven 7.369 (*) 7.250 - 7.300   pCO2, Ven 45.5  45.0 - 50.0 mmHg   pO2, Ven 36.0  30.0 - 45.0 mmHg   Bicarbonate 26.2 (*) 20.0 - 24.0 mEq/L   TCO2 28  0 - 100 mmol/L   O2 Saturation 67.0     Sample type VENOUS     Comment NOTIFIED PHYSICIAN    POCT I-STAT 3, BLOOD GAS (G3P V)     Status: Abnormal   Collection Time    04/03/13  5:16 PM      Result Value Range   pH, Ven 7.341 (*) 7.250 - 7.300   pCO2, Ven 45.7  45.0 - 50.0 mmHg   pO2, Ven 38.0  30.0 - 45.0 mmHg   Bicarbonate 24.7 (*) 20.0 - 24.0 mEq/L   TCO2 26  0 - 100 mmol/L   O2 Saturation 69.0     Acid-base deficit 1.0  0.0 - 2.0 mmol/L   Sample type VENOUS     Comment NOTIFIED PHYSICIAN    GLUCOSE, CAPILLARY     Status: Abnormal   Collection Time    04/03/13  5:28 PM      Result Value Range   Glucose-Capillary 199 (*) 70 - 99 mg/dL  POCT ACTIVATED CLOTTING TIME     Status: None   Collection Time    04/03/13  5:31 PM      Result Value Range   Activated Clotting Time 135    GLUCOSE, CAPILLARY     Status: Abnormal   Collection Time    04/03/13  9:41 PM      Result Value Range   Glucose-Capillary 199 (*) 70 - 99 mg/dL  CBC     Status: None   Collection Time    04/04/13  5:26 AM      Result Value Range   WBC 6.0  4.0 - 10.5 K/uL   RBC 4.38  3.87 - 5.11 MIL/uL    Hemoglobin 14.2  12.0 - 15.0 g/dL  HCT 40.6  36.0 - 46.0 %   MCV 92.7  78.0 - 100.0 fL   MCH 32.4  26.0 - 34.0 pg   MCHC 35.0  30.0 - 36.0 g/dL   RDW 16.1  09.6 - 04.5 %   Platelets 244  150 - 400 K/uL  GLUCOSE, CAPILLARY     Status: Abnormal   Collection Time    04/04/13  7:31 AM      Result Value Range   Glucose-Capillary 233 (*) 70 - 99 mg/dL  HEPARIN LEVEL (UNFRACTIONATED)     Status: Abnormal   Collection Time    04/04/13  9:25 AM      Result Value Range   Heparin Unfractionated <0.10 (*) 0.30 - 0.70 IU/mL   Comment:            IF HEPARIN RESULTS ARE BELOW     EXPECTED VALUES, AND PATIENT     DOSAGE HAS BEEN CONFIRMED,     SUGGEST FOLLOW UP TESTING     OF ANTITHROMBIN III LEVELS.  BASIC METABOLIC PANEL     Status: Abnormal   Collection Time    04/04/13  9:25 AM      Result Value Range   Sodium 136  135 - 145 mEq/L   Potassium 4.0  3.5 - 5.1 mEq/L   Comment: DELTA CHECK NOTED   Chloride 102  96 - 112 mEq/L   CO2 24  19 - 32 mEq/L   Glucose, Bld 246 (*) 70 - 99 mg/dL   BUN 5 (*) 6 - 23 mg/dL   Creatinine, Ser 4.09 (*) 0.50 - 1.10 mg/dL   Calcium 9.1  8.4 - 81.1 mg/dL   GFR calc non Af Amer >90  >90 mL/min   GFR calc Af Amer >90  >90 mL/min   Comment: (NOTE)     The eGFR has been calculated using the CKD EPI equation.     This calculation has not been validated in all clinical situations.     eGFR's persistently <90 mL/min signify possible Chronic Kidney     Disease.    No results found.  Review of Systems  Constitutional: Positive for malaise/fatigue.  HENT: Negative.   Eyes: Negative.   Respiratory: Positive for cough and shortness of breath. Negative for sputum production.   Cardiovascular: Positive for chest pain. Negative for leg swelling and PND.  Gastrointestinal: Negative.   Musculoskeletal: Negative.   Neurological: Positive for seizures and weakness.  Endo/Heme/Allergies: Negative.   Psychiatric/Behavioral:       Hx of mental illness per  family.   Blood pressure 108/59, pulse 71, temperature 98.5 F (36.9 C), temperature source Oral, resp. rate 24, height 5\' 3"  (1.6 m), weight 63.5 kg (139 lb 15.9 oz), SpO2 99.00%. Physical Exam  Constitutional: She is oriented to person, place, and time. She appears well-developed and well-nourished. No distress.  HENT:  Head: Normocephalic and atraumatic.  Mouth/Throat: Oropharynx is clear and moist.  Eyes: Conjunctivae and EOM are normal. Pupils are equal, round, and reactive to light.  Neck: Normal range of motion. No JVD present. No thyromegaly present.  Cardiovascular: Normal rate, regular rhythm, normal heart sounds and intact distal pulses.  Exam reveals no gallop and no friction rub.   No murmur heard. Respiratory: Effort normal and breath sounds normal. No respiratory distress. She has no wheezes. She has no rales.  GI: Soft. Bowel sounds are normal. She exhibits no distension and no mass. There is no tenderness.  Musculoskeletal: She exhibits  no edema.  Lymphadenopathy:    She has no cervical adenopathy.  Neurological: She is alert and oriented to person, place, and time. She has normal strength. No cranial nerve deficit or sensory deficit.  Skin: Skin is warm and dry.  Psychiatric: She has a normal mood and affect.   [facility]* *[facility]* [address] [place], [place] [address] [phone]  ------------------------------------------------------------ Transthoracic Echocardiography  Patient: [name] [name], [name] [name] MR #: [id number] Study Date: 04/03/2013 Gender: F Age: 72 Height: 154.9cm Weight: 61.3kg BSA: 1.14m^2 Pt. Status: Room: 6C03C  ADMITTING Nahser, Philip ATTENDING Nahser, Wynona Luna, [facility] SONOGRAPHER [place], RCS ORDERING Leeann Must Faylene Kurtz cc:  ------------------------------------------------------------ LV EF:  25%  ------------------------------------------------------------ Indications: Chest pain 786.51.  ------------------------------------------------------------ History: Risk factors: Current tobacco use. Diabetes mellitus. Dyslipidemia.  ------------------------------------------------------------ Study Conclusions  - Left ventricle: The cavity size was normal. Wall thickness was normal. The estimated ejection fraction was 25%. Akinesis of the mid to apical anterior, anteroseptal, and inferoseptal walls; the apical lateral wall, the apical inferior wall, and the true apex. There was an LV apical thrombus. Doppler parameters are consistent with abnormal left ventricular relaxation (grade 1 diastolic dysfunction). - Aortic valve: There was no stenosis. - Mitral valve: Mildly calcified annulus. Normal thickness leaflets . Mild regurgitation. - Left atrium: The atrium was mildly dilated. - Right ventricle: The cavity size was normal. Systolic function was mildly reduced. - Pulmonary arteries: PA systolic pressure 24-28 mmHg. - Systemic veins: IVC measured 1.9 cm with normal respirophasic variation, suggesting RA pressure 6-10 mmHg. Impressions:  - Normal LV size with severe systolic dysfunction, EF 25%. Wall motion abnormalities as noted above. There was an LV apical thrombus. Normal RV size with mild systolic dysfunction. Transthoracic echocardiography. M-mode, complete 2D, spectral Doppler, and color Doppler. Height: Height: 154.9cm. Height: 61in. Weight: Weight: 61.3kg. Weight: 134.9lb. Body mass index: BMI: 25.5kg/m^2. Body surface area: BSA: 1.57m^2. Blood pressure: 100/50. Patient status: Inpatient. Location: Bedside.  ------------------------------------------------------------  ------------------------------------------------------------ Left ventricle: The cavity size was normal. Wall thickness was normal. The estimated ejection fraction was 25%. Akinesis of the  mid to apical anterior, anteroseptal, and inferoseptal walls; the apical lateral wall, the apical inferior wall, and the true apex. There was an LV apical thrombus. Doppler parameters are consistent with abnormal left ventricular relaxation (grade 1 diastolic dysfunction).  ------------------------------------------------------------ Aortic valve: Trileaflet. Doppler: There was no stenosis. No regurgitation.  ------------------------------------------------------------ Aorta: Aortic root: The aortic root was normal in size. Ascending aorta: The ascending aorta was normal in size.  ------------------------------------------------------------ Mitral valve: Mildly calcified annulus. Normal thickness leaflets . Doppler: There was no evidence for stenosis. Mild regurgitation.  ------------------------------------------------------------ Left atrium: The atrium was mildly dilated.  ------------------------------------------------------------ Right ventricle: The cavity size was normal. Systolic function was mildly reduced.  ------------------------------------------------------------ Pulmonic valve: Structurally normal valve. Cusp separation was normal. Doppler: Transvalvular velocity was within the normal range. No regurgitation.  ------------------------------------------------------------ Tricuspid valve: Doppler: Trivial regurgitation.  ------------------------------------------------------------ Pulmonary artery: PA systolic pressure 24-28 mmHg.  ------------------------------------------------------------ Right atrium: The atrium was normal in size.  ------------------------------------------------------------ Pericardium: There was no pericardial effusion.  ------------------------------------------------------------ Systemic veins: IVC measured 1.9 cm with normal respirophasic variation, suggesting RA pressure 6-10  mmHg.  ------------------------------------------------------------  2D measurements Normal Doppler Normal Left ventricle measurements LVID ED, 46.2 mm 43-52 Left ventricle chord, Ea, lat 4.35 cm/ ------- PLAX ann, tiss s LVID ES, 39.5 mm 23-38 DP chord, E/Ea, lat 12.16 ------- PLAX ann, tiss FS, chord, 15 % >29 DP PLAX Ea,  med 5 cm/ ------- LVPW, ED 10.3 mm ------ ann, tiss s IVS/LVPW 1.13 <1.3 DP ratio, ED E/Ea, med 10.58 ------- Ventricular septum ann, tiss IVS, ED 11.6 mm ------ DP Aorta Mitral valve Root diam, 24 mm ------ Peak E vel 52.9 cm/ ------- ED s Left atrium Peak A vel 80.5 cm/ ------- AP dim 2.14 cm/m^2 <2.2 s index Deceleratio 180 ms 150-230 n time Peak E/A 0.7 ------- ratio Tricuspid valve Regurg peak 214 cm/ ------- vel s Peak RV-RA 18 mm ------- gradient, S Hg Max regurg 214 cm/ ------- vel s  ------------------------------------------------------------ Prepared and Electronically Authenticated by  Marca Ancona [id number]:[id number]   Cardiac Cath Procedure Note  Indication: NSTEMI, severe LV dysfunction  Procedures performed:  1) Right heart cathererization  2) Selective coronary angiography  3) L subclavian angiography  Description of procedure:   The risks and indication of the procedure were explained. Consent was signed and placed on the chart. An appropriate timeout was taken prior to the procedure. The right groin was prepped and draped in the routine sterile fashion and anesthetized with 1% local lidocaine.  A 5 FR arterial sheath was placed in the right femoral artery using a modified Seldinger technique. Standard catheters including a JL4 and JR4.were used. All catheter exchanges were made over a wire. We did not cross the aortic valve due to the presence of an LV apical thrombus on echo. A 7 FR venous sheath was placed in the right femoral vein using a modified Seldinger technique. A standard Swan-Ganz catheter was used for the  procedure.  Total contrast: 40cc  Complications: None apparent  Findings:  RA = 6  RV = 29/4/6  PA = 28/10 (18)  PCW = 10  Fick cardiac output/index =5.2/3.2  PVR = 1.5 WU  SVR = 1180  FA sat = 91%  PA sat = 67%, 69%  Ao Pressure: 107/64 (82)  Left main: Calcified. Mild to moderate diffuse narrowing probably in the 40% range  LAD: Heavily calcified with diffuse disease throughout. 90-95 ostial stenosis. Proximal to mid long 60% stenosis followed by 90% stenosis at the take-off of D2. 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 Diffusely calcified. Gives off large OM-1, Small OM-2. Moderate sized OM-3. Small PDA. LCX has 70-80% proximal lesion at take-off of OM-1. Diffuse 50-60% in mid vessel followed by 70-80% lesion prior to take-off of OM-2. Distal vessel has severe diffuse disease. OM-1 long 50-60% proximally.  RCA: Small co-dominant vessel. 95% ostial lesion with catheter damping. Diffuse 90% lesion in mid to distal vessel. Vessel is not graftable distally.  L subclavian: Widely patent  Assessment:  1. Severe 3v CAD  2. Severe LV dysfunction with apical clot  3. Well preserved hemodynamics  4. Patent L subclavian  Plan/Discussion:  She has severe CAD with diabetic vasculopathy and markedly decreased LV function. Will have TCTS see. With high grade ostial LAD lesion would resume heparin 8 hours after cath.  Arvilla Meres, MD  5:15 PM      Assessment/Plan:  She has severe multivessel coronary disease with small, diffusely diseased vessels and severe LV dysfunction. Her symptoms suggest angina and she presented with a NSTEMI. She is poorly compliant with heavy smoking and poorly controlled diabetes, and marked dyslipidemia and her long term prognosis is poor if she does not correct these risk factors. Her operative risk is very high with poor LV function, poor vessels and diabetes that is out of control. Unfortunately her LAD  is  very tight and there is no other good option. I don't know if her anterior wall will recover any function. I discussed the operative procedure with the patient and family ( brother and mother present) including alternatives, benefits and risks; including but not limited to bleeding, blood transfusion, infection, stroke, myocardial infarction, graft failure, heart block requiring a permanent pacemaker, organ dysfunction, and death.  She will think about it and discuss with her family.   Jazz Rogala K 04/04/2013, 5:08 PM

## 2013-04-04 NOTE — Progress Notes (Signed)
Patient ID: [name] [name], female   DOB: 1950-12-05, 62 y.o.   MRN: [id number]   SUBJECTIVE: No chest pain, no dyspnea.    RHC: RA = 6  RV = 29/4/6  PA = 28/10 (18)  PCW = 10  Fick cardiac output/index =5.2/3.2  PVR = 1.5 WU  SVR = 1180  FA sat = 91%  PA sat = 67%, 69% LHC with severe 3VD   Filed Vitals:   04/03/13 2029 04/04/13 0021 04/04/13 0400 04/04/13 0728  BP: 109/59 99/60 114/62 108/63  Pulse: 68 78 77 77  Temp:  97.5 F (36.4 C) 98 F (36.7 C) 97.9 F (36.6 C)  TempSrc:  Oral Oral Oral  Resp: 17 27 26 21   Height:      Weight:      SpO2: 98% 97% 95% 97%    Intake/Output Summary (Last 24 hours) at 04/04/13 0742 Last data filed at 04/04/13 0500  Gross per 24 hour  Intake  837.5 ml  Output    300 ml  Net  537.5 ml    LABS: Basic Metabolic Panel:  Recent Labs  40/98/11 0236 04/03/13 1210  NA 134* 134*  K 3.0* 3.3*  CL 95* 96  CO2 29 27  GLUCOSE 364* 277*  BUN 8 6  CREATININE 0.46* 0.40*  CALCIUM 9.1 8.8  MG 2.2  --    Liver Function Tests: No results found for this basename: AST, ALT, ALKPHOS, BILITOT, PROT, ALBUMIN,  in the last 72 hours No results found for this basename: LIPASE, AMYLASE,  in the last 72 hours CBC:  Recent Labs  04/03/13 0510 04/04/13 0526  WBC 5.3 6.0  HGB 13.2 14.2  HCT 38.5 40.6  MCV 90.6 92.7  PLT 225 244   Cardiac Enzymes:  Recent Labs  04/03/13 0236 04/03/13 0803 04/03/13 1404  TROPONINI 1.52* 1.61* 0.97*   BNP: No components found with this basename: POCBNP,  D-Dimer: No results found for this basename: DDIMER,  in the last 72 hours Hemoglobin A1C:  Recent Labs  04/03/13 0236  HGBA1C 11.9*   Fasting Lipid Panel:  Recent Labs  04/03/13 0236  CHOL 302*  HDL 35*  LDLCALC 207*  TRIG 299*  CHOLHDL 8.6   Thyroid Function Tests:  Recent Labs  04/03/13 0236  TSH 0.614   Anemia Panel: No results found for this basename: VITAMINB12, FOLATE, FERRITIN, TIBC, IRON, RETICCTPCT,  in the last 72  hours  RADIOLOGY: No results found.  PHYSICAL EXAM General: NAD Neck: No JVD, no thyromegaly or thyroid nodule.  Lungs: Clear to auscultation bilaterally with normal respiratory effort. CV: Nondisplaced PMI.  Heart regular S1/S2, no S3/S4, no murmur.  No peripheral edema.  No carotid bruit.  Normal pedal pulses.  Abdomen: Soft, nontender, no hepatosplenomegaly, no distention.  Neurologic: Alert and oriented x 3.  Psych: Normal affect. Extremities: No clubbing or cyanosis.   TELEMETRY: Reviewed telemetry pt in NSR  ASSESSMENT AND PLAN: 62 yo smoker with history of type II DM, epilepsy, and schizophrenia (not taking meds) presented to [facility] with NSTEMI.   1. CAD: NSTEMI.  LHC with severe 3VD including ostial LAD.  No good interventional option.   - CVTS consult - Continue ASA 81, statin, heparin gtt.  2. Ischemic cardiomyopathy: Filling pressures were not significantly elevated yesterday on RHC.  EF 25% with LAD wall motion abnormality pattern.   - Continue Coreg and enalapril at current doses.  - Will add spironolactone 12.5 mg daily.   -  Needs labs today.  3. LV thrombus: Heparin gtt, will eventually need coumadin.  Hold off for now with CVTS evaluation. 4. Neuro: h/o seizures, per daughter has schizophrenia but does not take meds.  She is appropriate in conversation but memory poor.  5. Diabetes: sliding scale insulin for now.   Marca Ancona 04/04/2013 7:46 AM

## 2013-04-04 NOTE — Progress Notes (Signed)
CARDIAC REHAB PHASE I   PRE:  Rate/Rhythm: Sinus Rhythm 74  BP:  Supine: 103/47      SaO2: 97 Room Air  MODE:  Ambulation: 170 ft   POST:  Rate/Rhythem: Sinus Rhythm 69  BP:  Supine: 103/47     SaO2: 98% room air  1010-1055  Referral appreciated. Patient ambulated without complaints or chest pain. Tolerated well.  Patient assisted back to bed with call bell within reach.  Whitaker, Arta Bruce RN, BSN

## 2013-04-04 NOTE — Progress Notes (Signed)
ANTICOAGULATION CONSULT NOTE - Follow Up Consult  Pharmacy Consult for heparin Indication: ACS / 3V CAD / LV thrombus   No Known Allergies  Patient Measurements: Height: 5\' 3"  (160 cm) Weight: 139 lb 15.9 oz (63.5 kg) IBW/kg (Calculated) : 52.4 Heparin Dosing Weight: 60 kg  Vital Signs: Temp: 98.5 F (36.9 C) (11/15 1653) Temp src: Oral (11/15 1653) BP: 77/43 mmHg (11/15 1700) Pulse Rate: 71 (11/15 1653)  Labs:  Recent Labs  04/03/13 0236  04/03/13 0510 04/03/13 0803 04/03/13 1210 04/03/13 1404 04/04/13 0526 04/04/13 0925 04/04/13 1800  HGB 14.1  --  13.2  --   --   --  14.2  --   --   HCT 39.2  --  38.5  --   --   --  40.6  --   --   PLT 224  --  225  --   --   --  244  --   --   LABPROT 13.6  --   --   --   --   --   --   --   --   INR 1.06  --   --   --   --   --   --   --   --   HEPARINUNFRC  --   < > 0.14*  --  0.23*  --   --  <0.10* 0.25*  CREATININE 0.46*  --   --   --  0.40*  --   --  0.43*  --   TROPONINI 1.52*  --   --  1.61*  --  0.97*  --   --   --   < > = values in this interval not displayed.  Estimated Creatinine Clearance: 67.1 ml/min (by C-G formula based on Cr of 0.43).  Assessment: Patient is a 62 y.o F s/p cath on 11/14 with noted 3V CAD with plan for possible CABG (awaiting patient's decision).  Heparin level is still below goal at 0.25 despite rate increased to 1200 unit/hr this morning.  Per RN, no issues with IV line or bleeding noted.  Goal of Therapy:  Heparin level 0.3-0.7 units/ml Monitor platelets by anticoagulation protocol: Yes   Plan:  1) increase heparin drip to 1300 units/hr 2) check 6 hour heparin level  Ky Moskowitz P 04/04/2013,7:35 PM

## 2013-04-05 DIAGNOSIS — I2510 Atherosclerotic heart disease of native coronary artery without angina pectoris: Secondary | ICD-10-CM

## 2013-04-05 LAB — BASIC METABOLIC PANEL
BUN: 10 mg/dL (ref 6–23)
CO2: 24 mEq/L (ref 19–32)
GFR calc non Af Amer: >90 mL/min (ref >90)
Glucose, Bld: 201 mg/dL — ABNORMAL HIGH (ref 70–99)
Potassium: 3.4 mEq/L — ABNORMAL LOW (ref 3.5–5.1)
Sodium: 135 mEq/L (ref 135–145)

## 2013-04-05 LAB — GLUCOSE, CAPILLARY
Glucose-Capillary: 211 mg/dL — ABNORMAL HIGH (ref 70–99)
Glucose-Capillary: 199 mg/dL — ABNORMAL HIGH (ref 70–99)

## 2013-04-05 LAB — CBC
HCT: 39.3 % (ref 36.0–46.0)
MCH: 32.4 pg (ref 26.0–34.0)
MCHC: 34.9 g/dL (ref 30.0–36.0)
Platelets: 230 10*3/uL (ref 150–400)
WBC: 5.8 10*3/uL (ref 4.0–10.5)

## 2013-04-05 MED ORDER — INSULIN GLARGINE 100 UNIT/ML ~~LOC~~ SOLN
10.0000 [IU] | Freq: Every day | SUBCUTANEOUS | Status: DC
  Administered 2013-04-05: 10 [IU] via SUBCUTANEOUS
  Filled 2013-04-05: qty 0.1

## 2013-04-05 MED ORDER — POTASSIUM CHLORIDE CRYS ER 20 MEQ PO TBCR
40.0000 meq | EXTENDED_RELEASE_TABLET | Freq: Once | ORAL | Status: AC
  Administered 2013-04-05: 40 meq via ORAL
  Filled 2013-04-05: qty 2

## 2013-04-05 NOTE — Progress Notes (Signed)
Patient ID: [name] [name], female   DOB: September 09, 1950, 62 y.o.   MRN: [id number]   SUBJECTIVE: BP running low today.  Coreg/enalapril were held.  She denies dyspnea or chest pain.  She is not lightheaded.  She has gum pain, this is her main concern this morning.   RHC: RA = 6  RV = 29/4/6  PA = 28/10 (18)  PCW = 10  Fick cardiac output/index =5.2/3.2  PVR = 1.5 WU  SVR = 1180  FA sat = 91%  PA sat = 67%, 69% LHC with severe 3VD  . aspirin EC  81 mg Oral Daily  . atorvastatin  80 mg Oral q1800  . insulin aspart  0-15 Units Subcutaneous TID WC  . insulin aspart  0-5 Units Subcutaneous QHS  . phenytoin  100 mg Oral QHS  . potassium chloride  40 mEq Oral Once  . sodium chloride  3 mL Intravenous Q12H  . spironolactone  12.5 mg Oral Daily  heparin gtt   Filed Vitals:   04/05/13 0100 04/05/13 0400 04/05/13 0600 04/05/13 0749  BP: 81/65 91/40 96/36  90/40  Pulse:  60  69  Temp:  98.4 F (36.9 C)  98.5 F (36.9 C)  TempSrc:  Oral  Oral  Resp:  19  16  Height:      Weight:  135 lb 5.8 oz (61.4 kg)    SpO2:  97%  98%    Intake/Output Summary (Last 24 hours) at 04/05/13 1023 Last data filed at 04/05/13 0921  Gross per 24 hour  Intake  892.9 ml  Output      0 ml  Net  892.9 ml    LABS: Basic Metabolic Panel:  Recent Labs  14/78/29 0236  04/04/13 0925 04/05/13 0200  NA 134*  < > 136 135  K 3.0*  < > 4.0 3.4*  CL 95*  < > 102 101  CO2 29  < > 24 24  GLUCOSE 364*  < > 246* 201*  BUN 8  < > 5* 10  CREATININE 0.46*  < > 0.43* 0.41*  CALCIUM 9.1  < > 9.1 9.5  MG 2.2  --   --   --   < > = values in this interval not displayed. Liver Function Tests: No results found for this basename: AST, ALT, ALKPHOS, BILITOT, PROT, ALBUMIN,  in the last 72 hours No results found for this basename: LIPASE, AMYLASE,  in the last 72 hours CBC:  Recent Labs  04/04/13 0526 04/05/13 0200  WBC 6.0 5.8  HGB 14.2 13.7  HCT 40.6 39.3  MCV 92.7 92.9  PLT 244 230   Cardiac  Enzymes:  Recent Labs  04/03/13 0236 04/03/13 0803 04/03/13 1404  TROPONINI 1.52* 1.61* 0.97*   BNP: No components found with this basename: POCBNP,  D-Dimer: No results found for this basename: DDIMER,  in the last 72 hours Hemoglobin A1C:  Recent Labs  04/03/13 0236  HGBA1C 11.9*   Fasting Lipid Panel:  Recent Labs  04/03/13 0236  CHOL 302*  HDL 35*  LDLCALC 207*  TRIG 299*  CHOLHDL 8.6   Thyroid Function Tests:  Recent Labs  04/03/13 0236  TSH 0.614   Anemia Panel: No results found for this basename: VITAMINB12, FOLATE, FERRITIN, TIBC, IRON, RETICCTPCT,  in the last 72 hours  RADIOLOGY: No results found.  PHYSICAL EXAM General: NAD Neck: No JVD, no thyromegaly or thyroid nodule.  Lungs: Clear to auscultation bilaterally with normal respiratory effort. CV:  Nondisplaced PMI.  Heart regular S1/S2, no S3/S4, no murmur.  No peripheral edema.  No carotid bruit.  Normal pedal pulses.  Abdomen: Soft, nontender, no hepatosplenomegaly, no distention.  Neurologic: Alert and oriented x 3.  Psych: Normal affect. Extremities: No clubbing or cyanosis.   TELEMETRY: Reviewed telemetry pt in NSR  ASSESSMENT AND PLAN: 62 yo smoker with history of type II DM, epilepsy, and schizophrenia (not taking meds) presented to [facility] with NSTEMI.   1. CAD: NSTEMI.  LHC with severe 3VD including ostial LAD.  No good interventional option.  She would be high risk for CABG with cardiomyopathy, poor diabetes control, and ongoing smoking.  This is complicated by her memory deficits and her poor understanding of the procedure.  Her family is not here today to talk with.  However, without some form of intervention I think that her long-term prognosis will be very poor.  - CVTS has seen.  - I am going to see if she could cooperate with a cardiac MRI tomorrow to look for viability in the anterior and anteroseptal walls.  If the LAD territory appears viable, this would push me more  towards CABG.  If nonviable, probably best to hold off and treat medically.  - Continue ASA 81, statin, heparin gtt.  2. Ischemic cardiomyopathy: Filling pressures were not significantly elevated yesterday on RHC.  EF 25% with LAD wall motion abnormality pattern.   - Coreg and enalapril currently on hold with soft BP.  - Can continue spironolactone.  3. LV thrombus: Heparin gtt, will eventually need coumadin.  Hold off for now with CVTS evaluation. 4. Neuro: h/o seizures, per daughter has schizophrenia but does not take meds. Memory poor.  5. Diabetes: sliding scale insulin for now and will add Lantus qhs.   Marca Ancona 04/05/2013 10:23 AM

## 2013-04-05 NOTE — Progress Notes (Signed)
[name] Newcomer, PA paged to discuss with family pt's plan of care.

## 2013-04-05 NOTE — Progress Notes (Signed)
ANTICOAGULATION CONSULT NOTE - Follow Up Consult  Pharmacy Consult for heparin Indication: ACS / 3V CAD / LV thrombus   No Known Allergies  Patient Measurements: Height: 5\' 3"  (160 cm) Weight: 135 lb 5.8 oz (61.4 kg) IBW/kg (Calculated) : 52.4 Heparin Dosing Weight: 60 kg  Vital Signs: Temp: 98.5 F (36.9 C) (11/16 0749) Temp src: Oral (11/16 0749) BP: 90/40 mmHg (11/16 0749) Pulse Rate: 69 (11/16 0749)  Labs:  Recent Labs  04/03/13 0236  04/03/13 0510 04/03/13 0803 04/03/13 1210 04/03/13 1404 04/04/13 0526 04/04/13 0925 04/04/13 1800 04/05/13 0200  HGB 14.1  --  13.2  --   --   --  14.2  --   --  13.7  HCT 39.2  --  38.5  --   --   --  40.6  --   --  39.3  PLT 224  --  225  --   --   --  244  --   --  230  LABPROT 13.6  --   --   --   --   --   --   --   --   --   INR 1.06  --   --   --   --   --   --   --   --   --   HEPARINUNFRC  --   < > 0.14*  --  0.23*  --   --  <0.10* 0.25* 0.36  CREATININE 0.46*  --   --   --  0.40*  --   --  0.43*  --  0.41*  TROPONINI 1.52*  --   --  1.61*  --  0.97*  --   --   --   --   < > = values in this interval not displayed.  Estimated Creatinine Clearance: 61.9 ml/min (by C-G formula based on Cr of 0.41).  Assessment: Patient is a 62 y.o F s/p cath on 11/14 with noted 3V CAD with plan for possible CABG (awaiting MRI viability study and  patient's decision).  Heparin level 0.36 at goal drip rate 1300 uts/hr.  CBC stable, no bleeding noted.  Goal of Therapy:  Heparin level 0.3-0.7 units/ml Monitor platelets by anticoagulation protocol: Yes   Plan:  1) Continue heparin drip to 1300 units/hr 2) daily cbc and heparin level  Leota Sauers Pharm.D. CPP, BCPS Clinical Pharmacist [phone] 04/05/2013 10:44 AM

## 2013-04-06 ENCOUNTER — Encounter (HOSPITAL_COMMUNITY): Payer: Self-pay | Admitting: *Deleted

## 2013-04-06 ENCOUNTER — Other Ambulatory Visit: Payer: Self-pay | Admitting: *Deleted

## 2013-04-06 ENCOUNTER — Inpatient Hospital Stay (HOSPITAL_COMMUNITY): Payer: Medicaid Other

## 2013-04-06 DIAGNOSIS — I214 Non-ST elevation (NSTEMI) myocardial infarction: Secondary | ICD-10-CM

## 2013-04-06 DIAGNOSIS — Z01810 Encounter for preprocedural cardiovascular examination: Secondary | ICD-10-CM

## 2013-04-06 DIAGNOSIS — I2510 Atherosclerotic heart disease of native coronary artery without angina pectoris: Secondary | ICD-10-CM

## 2013-04-06 LAB — CBC
HCT: 38.4 % (ref 36.0–46.0)
MCH: 31.4 pg (ref 26.0–34.0)
MCHC: 34.1 g/dL (ref 30.0–36.0)
Platelets: 230 10*3/uL (ref 150–400)

## 2013-04-06 LAB — GLUCOSE, CAPILLARY
Glucose-Capillary: 198 mg/dL — ABNORMAL HIGH (ref 70–99)
Glucose-Capillary: 169 mg/dL — ABNORMAL HIGH (ref 70–99)
Glucose-Capillary: 106 mg/dL — ABNORMAL HIGH (ref 70–99)

## 2013-04-06 LAB — BASIC METABOLIC PANEL
BUN: 7 mg/dL (ref 6–23)
CO2: 23 mEq/L (ref 19–32)
Calcium: 9.3 mg/dL (ref 8.4–10.5)
Chloride: 101 mEq/L (ref 96–112)
Creatinine, Ser: 0.41 mg/dL — ABNORMAL LOW (ref 0.50–1.10)
GFR calc Af Amer: >90 mL/min (ref >90)
Glucose, Bld: 188 mg/dL — ABNORMAL HIGH (ref 70–99)

## 2013-04-06 LAB — HEPARIN LEVEL (UNFRACTIONATED): Heparin Unfractionated: 0.65 IU/mL (ref 0.30–0.70)

## 2013-04-06 MED ORDER — INSULIN GLARGINE 100 UNIT/ML ~~LOC~~ SOLN
14.0000 [IU] | Freq: Every day | SUBCUTANEOUS | Status: DC
  Administered 2013-04-06 – 2013-04-09 (×4): 14 [IU] via SUBCUTANEOUS
  Filled 2013-04-06 (×5): qty 0.14

## 2013-04-06 MED ORDER — GADOBENATE DIMEGLUMINE 529 MG/ML IV SOLN
20.0000 mL | Freq: Once | INTRAVENOUS | Status: AC
  Administered 2013-04-06: 20 mL via INTRAVENOUS

## 2013-04-06 NOTE — Care Management Note (Addendum)
    Page 1 of 2   04/14/2013     1:53:17 PM   CARE MANAGEMENT NOTE 04/14/2013  Patient:  [name] [name]   Account Number:  [id number]  Date Initiated:  04/06/2013  Documentation initiated by:  Junius Creamer  Subjective/Objective Assessment:   adm w mi     Action/Plan:   lives w fam   Anticipated DC Date:  04/15/2013   Anticipated DC Plan:  HOME/SELF CARE  In-house referral  Clinical Social Worker      DC Planning Services  CM consult      Choice offered to / List presented to:             Status of service:  In process, will continue to follow Medicare Important Message given?   (If response is "NO", the following Medicare IM given date fields will be blank) Date Medicare IM given:   Date Additional Medicare IM given:    Discharge Disposition:    Per UR Regulation:  Reviewed for med. necessity/level of care/duration of stay  If discussed at Long Length of Stay Meetings, dates discussed:   04/09/2013  04/14/2013    Comments:  04/14/13 [name] Maciver,RN,BSN [phone] SPOKE WITH PT AND DAUGHTER [name] [name] TO CONFIRM DC PLANS.  PT STATES MOTHER AND HER DAUGHTER [name] [name] WILL PROVIDE 24HR CARE AT DC.  CONFIRMED THESE PLANS WITH [name] [name] BY PHONE [phone]); SHE STATES SOMEONE WILL BE WITH PT AT ALL TIMES.  PT PROGRESSING WELL WITH CARDIAC REHAB; AMBULATING WITHOUT ASSISTIVE DEVICE.  11/20 142a [name] dowell rn,bsn pt working w card rehab. 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 pt has been told that ins will not cover someone to stay w her 24hrs per day. pt states has aid at home. [place] in [place]. fam feels pt will stay w her mother after disch.have made sw ref in case ends up needing snf for 2 weeks post surg.

## 2013-04-06 NOTE — Progress Notes (Signed)
Inpatient Diabetes Program Recommendations  AACE/ADA: New Consensus Statement on Inpatient Glycemic Control (2013)  Target Ranges:  Prepandial:   less than 140 mg/dL      Peak postprandial:   less than 180 mg/dL (1-2 hours)      Critically ill patients:  140 - 180 mg/dL  Results for [name] [name], [name] [name] (MRN [id number]) as of 04/06/2013 11:32  Ref. Range 04/05/2013 07:55 04/05/2013 11:57 04/05/2013 17:06 04/05/2013 21:38 04/06/2013 07:49  Glucose-Capillary Latest Range: 70-99 mg/dL 272 (H) 536 (H) 644 (H) 113 (H) 198 (H)   Inpatient Diabetes Program Recommendations Diet: Add carb modified to current heart healthy diet May benefit from the addition of Novolog meal coverage 3 units TID with meals.  If meal coverage is added consider lowering the correction scale to sensitive. Thank you  Piedad Climes BSN, RN,CDE Inpatient Diabetes Coordinator [phone] (team pager)

## 2013-04-06 NOTE — Progress Notes (Signed)
ANTICOAGULATION CONSULT NOTE - Follow Up Consult  Pharmacy Consult for heparin Indication: ACS / 3V CAD / LV thrombus   No Known Allergies  Patient Measurements: Height: 5\' 3"  (160 cm) Weight: 135 lb 5.8 oz (61.4 kg) IBW/kg (Calculated) : 52.4 Heparin Dosing Weight: 60 kg  Vital Signs: Temp: 98.4 F (36.9 C) (11/17 0745) Temp src: Oral (11/17 0745) BP: 93/45 mmHg (11/17 0800) Pulse Rate: 52 (11/17 0800)  Labs:  Recent Labs  04/03/13 1404  04/04/13 0526 04/04/13 0925 04/04/13 1800 04/05/13 0200 04/06/13 0540  HGB  --   < > 14.2  --   --  13.7 13.1  HCT  --   --  40.6  --   --  39.3 38.4  PLT  --   --  244  --   --  230 230  HEPARINUNFRC  --   --   --  <0.10* 0.25* 0.36 0.65  CREATININE  --   --   --  0.43*  --  0.41* 0.41*  TROPONINI 0.97*  --   --   --   --   --   --   < > = values in this interval not displayed.  Estimated Creatinine Clearance: 61.9 ml/min (by C-G formula based on Cr of 0.41).  Assessment: Patient is a 62 y.o F s/p cath on 11/14 with noted 3V CAD with plan for possible CABG (awaiting MRI viability study and  patient's decision).  Heparin level 0.65 at goal drip rate 1300 uts/hr.  CBC stable, no bleeding noted.  Goal of Therapy:  Heparin level 0.3-0.7 units/ml Monitor platelets by anticoagulation protocol: Yes   Plan:  1) Continue heparin drip to 1300 units/hr 2) Daily cbc and heparin level 3) F/u plan for possible CABG.  Tad Moore, BCPS  Clinical Pharmacist Pager [phone]  04/06/2013 9:50 AM

## 2013-04-06 NOTE — Progress Notes (Signed)
Patient can transport to MRI with out the nurse. New order given and implemented.  Imya Mance, Charlaine Dalton RN

## 2013-04-06 NOTE — Progress Notes (Addendum)
Pre-op Cardiac Surgery  Carotid Findings:  Bilateral:  1-39% ICA stenosis.  Vertebral artery flow is antegrade.    Farrel Demark, RDMS, RVT 04/06/2013    Upper Extremity Right Left  Brachial Pressures 107  Triphasic  86 Triphasic   Radial Waveforms Triphasic  Triphasic  Ulnar Waveforms Triphasic  Triphasic  Palmar Arch (Allen's Test) Within normal limits  Within normal limits      Lower  Extremity Right Left  Dorsalis Pedis 112  Monophasic  107  Monophasic   Anterior Tibial    Posterior Tibial 123 Monophasic  Not found  Ankle/Brachial Indices >1 1     Domique Clapper, RVT 04/07/2013 10:20 AM

## 2013-04-06 NOTE — Progress Notes (Signed)
Subjective:  Patient states she is feeling better. No chest pain overnight. Rhythm stable NSR.  BP is still soft. On IV heparin for LV apical thrombus. Awaiting cardiac MRI today.  Objective:  Vital Signs in the last 24 hours: Temp:  [97.5 F (36.4 C)-99.1 F (37.3 C)] 98.4 F (36.9 C) (11/17 0400) Pulse Rate:  [64-76] 64 (11/17 0400) Resp:  [16-24] 22 (11/17 0400) BP: (83-110)/(37-65) 101/37 mmHg (11/17 0400) SpO2:  [95 %-100 %] 96 % (11/17 0400)  Intake/Output from previous day: 11/16 0701 - 11/17 0700 In: 1115 [P.O.:560; I.V.:555] Out: 800 [Urine:800] Intake/Output from this shift:    . aspirin EC  81 mg Oral Daily  . atorvastatin  80 mg Oral q1800  . insulin aspart  0-15 Units Subcutaneous TID WC  . insulin aspart  0-5 Units Subcutaneous QHS  . insulin glargine  10 Units Subcutaneous QHS  . phenytoin  100 mg Oral QHS  . sodium chloride  3 mL Intravenous Q12H  . spironolactone  12.5 mg Oral Daily   . sodium chloride 10 mL (04/05/13 0700)  . heparin 1,300 Units/hr (04/06/13 0234)    Physical Exam: The patient appears to be in no distress.  Head and neck exam reveals that the pupils are equal and reactive.  The extraocular movements are full.  There is no scleral icterus.  Mouth and pharynx are benign.  No lymphadenopathy.  No carotid bruits.  The jugular venous pressure is normal.  Thyroid is not enlarged or tender.  Chest is clear to percussion and auscultation.  No rales or rhonchi.  Expansion of the chest is symmetrical.  Heart reveals no abnormal lift or heave.  First and second heart sounds are normal.  There is no murmur gallop rub or click.  The abdomen is soft and nontender.  Bowel sounds are normoactive.  There is no hepatosplenomegaly or mass.  There are no abdominal bruits.  Extremities reveal no phlebitis or edema.    There is no cyanosis or clubbing.  Neurologic exam is normal strength and no lateralizing weakness.  No sensory  deficits.  Integument reveals no rash  Lab Results:  Recent Labs  04/05/13 0200 04/06/13 0540  WBC 5.8 5.1  HGB 13.7 13.1  PLT 230 230    Recent Labs  04/05/13 0200 04/06/13 0540  NA 135 136  K 3.4* 3.8  CL 101 101  CO2 24 23  GLUCOSE 201* 188*  BUN 10 7  CREATININE 0.41* 0.41*    Recent Labs  04/03/13 0803 04/03/13 1404  TROPONINI 1.61* 0.97*   Hepatic Function Panel No results found for this basename: PROT, ALBUMIN, AST, ALT, ALKPHOS, BILITOT, BILIDIR, IBILI,  in the last 72 hours No results found for this basename: CHOL,  in the last 72 hours No results found for this basename: PROTIME,  in the last 72 hours  Imaging: ** No results found for the last 168 hours. **   Cardiac Studies: Telemetry shows NSR Assessment/Plan:  62 yo smoker with history of type II DM, epilepsy, and schizophrenia (not taking meds) presented to [facility] with NSTEMI.  1. CAD: NSTEMI. LHC with severe 3VD including ostial LAD. No good interventional option. She would be high risk for CABG with cardiomyopathy, poor diabetes control, and ongoing smoking. This is complicated by her memory deficits and her poor understanding of the procedure. Her family is not here today to talk with. However, without some form of intervention I think that her long-term prognosis  will be very poor.  - CVTS has seen.  - I am going to see if she could cooperate with a cardiac MRI tomorrow to look for viability in the anterior and anteroseptal walls. If the LAD territory appears viable, this would push me more towards CABG. If nonviable, probably best to hold off and treat medically.  - Continue ASA 81, statin, heparin gtt.  2. Ischemic cardiomyopathy: Filling pressures were not significantly elevated yesterday on RHC. EF 25% with LAD wall motion abnormality pattern.  - Coreg and enalapril currently on hold with soft BP.  - Can continue spironolactone.  3. LV thrombus: Heparin gtt, will eventually need  coumadin. Hold off for now with CVTS evaluation.  4. Neuro: h/o seizures, per daughter has schizophrenia but does not take meds. Memory poor. No recent seizures. 5. Diabetes: sliding scale insulin for now. Blood sugars improving on Lantus. A1C 11.9   Plan: Cardiac MRI today to look for anterior wall viability.  LOS: 3 days    Cassell Clement 04/06/2013, 7:40 AM

## 2013-04-06 NOTE — Progress Notes (Signed)
CARDIAC REHAB PHASE I   PRE:  Rate/Rhythm: 69 SR  BP:  Supine:   Sitting: 85/42  Standing:    SaO2: 99 RA  MODE:  Ambulation: 700 ft   POST:  Rate/Rhythm: 92 SR  BP:  Supine:   Sitting: 94/49  Standing:    SaO2: 100 RA 1110-1135 Assisted X 1 to ambulate. Gait steady. Pt able to walk 700 feet without c/o of cp, sob or dizziness. Pt back to recliner after walk with call light in reach.  Melina Copa RN 04/06/2013 11:30 AM

## 2013-04-07 ENCOUNTER — Encounter (HOSPITAL_COMMUNITY): Payer: Medicaid Other

## 2013-04-07 ENCOUNTER — Other Ambulatory Visit (HOSPITAL_COMMUNITY): Payer: Self-pay | Admitting: Emergency Medicine

## 2013-04-07 DIAGNOSIS — Z01810 Encounter for preprocedural cardiovascular examination: Secondary | ICD-10-CM

## 2013-04-07 DIAGNOSIS — R0602 Shortness of breath: Secondary | ICD-10-CM

## 2013-04-07 LAB — PULMONARY FUNCTION TEST
FEF 25-75 Pre: 1.99 L/sec
FEF2575-%Pred-Pre: 99 %
FEV1-%Pred-Pre: 85 %
FEV6-Pre: 1.86 L
FEV6FVC-%Pred-Pre: 104 %
FVC-%Pred-Pre: 73 %
FVC-Pre: 1.86 L
Pre FEV6/FVC Ratio: 100 %

## 2013-04-07 LAB — CBC
HCT: 39.3 % (ref 36.0–46.0)
Hemoglobin: 13.6 g/dL (ref 12.0–15.0)
MCHC: 34.6 g/dL (ref 30.0–36.0)
Platelets: 235 10*3/uL (ref 150–400)
RBC: 4.2 MIL/uL (ref 3.87–5.11)

## 2013-04-07 LAB — GLUCOSE, CAPILLARY
Glucose-Capillary: 183 mg/dL — ABNORMAL HIGH (ref 70–99)
Glucose-Capillary: 157 mg/dL — ABNORMAL HIGH (ref 70–99)
Glucose-Capillary: 249 mg/dL — ABNORMAL HIGH (ref 70–99)
Glucose-Capillary: 159 mg/dL — ABNORMAL HIGH (ref 70–99)

## 2013-04-07 LAB — HEPARIN LEVEL (UNFRACTIONATED): Heparin Unfractionated: 0.75 IU/mL — ABNORMAL HIGH (ref 0.30–0.70)

## 2013-04-07 NOTE — Progress Notes (Signed)
CARDIAC REHAB PHASE I   PRE:  Rate/Rhythm: 65 SR  BP:  Supine:   Sitting: 119/42  Standing:    SaO2:   MODE:  Ambulation: 700 ft   POST:  Rate/Rhythm: 90SR  BP:  Supine:   Sitting: 119/47  Standing:    SaO2:  1125-1200 Pt tolerated ambulation well without c/o of cp or SOB. VS stable Completed pre-op [facility] education with pt and a family member. Discussed sternal precautions, walking post-op and use of IS. Discussed with pt that she needs 24/7 care for the first week at discharge after heart surgery.We did discuss smoking cessation. Pt admits that she feels that it is going to be hard for to quit.  Melina Copa RN 04/07/2013 11:52 AM

## 2013-04-07 NOTE — Progress Notes (Signed)
ANTICOAGULATION CONSULT NOTE - Follow Up Consult  Pharmacy Consult for heparin Indication: ACS / 3V CAD / LV thrombus No Known Allergies  Patient Measurements: Height: 5\' 3"  (160 cm) Weight: 135 lb 5.8 oz (61.4 kg) IBW/kg (Calculated) : 52.4 Heparin Dosing Weight: 60 kg  Vital Signs: Temp: 97.1 F (36.2 C) (11/18 0800) Temp src: Oral (11/18 0800) BP: 96/69 mmHg (11/18 0800)  Labs:  Recent Labs  04/05/13 0200 04/06/13 0540 04/07/13 0500  HGB 13.7 13.1 13.6  HCT 39.3 38.4 39.3  PLT 230 230 235  HEPARINUNFRC 0.36 0.65 0.75*  CREATININE 0.41* 0.41*  --     Estimated Creatinine Clearance: 61.9 ml/min (by C-G formula based on Cr of 0.41).  Assessment: Patient is a 62 y.o F s/p cath on 11/14 with noted 3V CAD with plan for  CABG .   Events: TCTS eval for CABG in process.  Anticoag: ACS & apical thrombus:   Heparin level 0.75 just above goal at drip rate 1300 uts/hr.  CBC stable, no bleeding noted.  Goal of Therapy:  Heparin level 0.3-0.7 units/ml Monitor platelets by anticoagulation protocol: Yes   Plan:  1) Decrease heparin drip to 1200 units/hr 2) Daily cbc and heparin level 3) F/u plan for CABG.   Thank you for allowing pharmacy to be a part of this patients care team.  Lovenia Kim Pharm.D., BCPS Clinical Pharmacist 04/07/2013 10:08 AM Pager: [phone] Phone: [phone]

## 2013-04-07 NOTE — Progress Notes (Signed)
Pt has a brother that states that the patients 62 year old mother take care of her. Pt has two daughters that came to visit, the patient's daughters and her brother do not see eye to eye on patients care.

## 2013-04-07 NOTE — Progress Notes (Signed)
Patient ID: [name] [name], female   DOB: August 20, 1950, 62 y.o.   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  Not acute confusion  Objective:  Vital Signs in the last 24 hours: Temp:  [98.2 F (36.8 C)-98.6 F (37 C)] 98.6 F (37 C) (11/18 0401) Pulse Rate:  [52] 52 (11/17 0800) Resp:  [16-25] 16 (11/18 0400) BP: (85-125)/(42-57) 103/57 mmHg (11/18 0400) SpO2:  [96 %-98 %] 97 % (11/18 0401)  Intake/Output from previous day: 11/17 0701 - 11/18 0700 In: 555 [I.V.:555] Out: 251 [Urine:250; Stool:1] Intake/Output from this shift:    . aspirin EC  81 mg Oral Daily  . atorvastatin  80 mg Oral q1800  . insulin aspart  0-15 Units Subcutaneous TID WC  . insulin aspart  0-5 Units Subcutaneous QHS  . insulin glargine  14 Units Subcutaneous QHS  . phenytoin  100 mg Oral QHS  . sodium chloride  3 mL Intravenous Q12H  . spironolactone  12.5 mg Oral Daily   . sodium chloride 10 mL/hr (04/06/13 2307)  . heparin 1,300 Units/hr (04/06/13 2306)    Physical Exam: The patient appears to be in no distress.  Head and neck exam reveals that the pupils are equal and reactive.  The extraocular movements are full.  There is no scleral icterus.  Mouth and pharynx are benign.  No lymphadenopathy.  No carotid bruits.  The jugular venous pressure is normal.  Thyroid is not enlarged or tender.  Chest is clear to percussion and auscultation.  No rales or rhonchi.  Expansion of the chest is symmetrical.  Heart reveals no abnormal lift or heave.  First and second heart sounds are normal.  There is no murmur gallop rub or click.  The abdomen is soft and nontender.  Bowel sounds are normoactive.  There is no hepatosplenomegaly or mass.  There are no abdominal bruits.  Extremities reveal no phlebitis or edema.    There is no cyanosis or clubbing.  Neurologic exam is normal strength and no lateralizing  weakness.  No sensory deficits.  Integument reveals no rash  Lab Results:  Recent Labs  04/06/13 0540 04/07/13 0500  WBC 5.1 5.6  HGB 13.1 13.6  PLT 230 235    Recent Labs  04/05/13 0200 04/06/13 0540  NA 135 136  K 3.4* 3.8  CL 101 101  CO2 24 23  GLUCOSE 201* 188*  BUN 10 7  CREATININE 0.41* 0.41*   No results found for this basename: TROPONINI, CK, MB,  in the last 72 hours Hepatic Function Panel No results found for this basename: PROT, ALBUMIN, AST, ALT, ALKPHOS, BILITOT, BILIDIR, IBILI,  in the last 72 hours No results found for this basename: CHOL,  in the last 72 hours No results found for this basename: PROTIME,  in the last 72 hours  Imaging: MRI with viability other than apex   Cardiac Studies: Telemetry shows NSR Assessment/Plan:  Ischemic DCM:  Non revascularizable other than surgery.  MRI with viability.  ? Stitch procedure to eliminate apex She has some mental retardation or such Lives independantly.  No acute memory issues.  Plan per CVTS but CABG would appear to be best option despite comorbidities   LOS: 4 days    [name] 04/07/2013, 7:54 AM

## 2013-04-08 LAB — CBC
HCT: 37.5 % (ref 36.0–46.0)
Platelets: 238 10*3/uL (ref 150–400)
RBC: 4.05 MIL/uL (ref 3.87–5.11)
RDW: 12.9 % (ref 11.5–15.5)
WBC: 4.4 10*3/uL (ref 4.0–10.5)

## 2013-04-08 LAB — GLUCOSE, CAPILLARY
Glucose-Capillary: 244 mg/dL — ABNORMAL HIGH (ref 70–99)
Glucose-Capillary: 124 mg/dL — ABNORMAL HIGH (ref 70–99)
Glucose-Capillary: 172 mg/dL — ABNORMAL HIGH (ref 70–99)

## 2013-04-08 LAB — SURGICAL PCR SCREEN: MRSA, PCR: NEGATIVE

## 2013-04-08 LAB — HEPARIN LEVEL (UNFRACTIONATED): Heparin Unfractionated: 0.71 IU/mL — ABNORMAL HIGH (ref 0.30–0.70)

## 2013-04-08 MED ORDER — CHLORHEXIDINE GLUCONATE CLOTH 2 % EX PADS: 6.0000 | MEDICATED_PAD | Freq: Every day | CUTANEOUS | Status: DC

## 2013-04-08 MED ORDER — HEPARIN (PORCINE) IN NACL 100-0.45 UNIT/ML-% IJ SOLN
1100.0000 [IU]/h | INTRAMUSCULAR | Status: DC
  Administered 2013-04-08 – 2013-04-09 (×2): 1100 [IU]/h via INTRAVENOUS
  Filled 2013-04-08 (×3): qty 250

## 2013-04-08 MED ORDER — MUPIROCIN 2 % EX OINT
1.0000 "application " | TOPICAL_OINTMENT | Freq: Two times a day (BID) | CUTANEOUS | Status: AC
  Administered 2013-04-08 – 2013-04-13 (×9): 1 via NASAL
  Filled 2013-04-08 (×2): qty 22

## 2013-04-08 MED ORDER — CHLORHEXIDINE GLUCONATE CLOTH 2 % EX PADS
6.0000 | MEDICATED_PAD | Freq: Every day | CUTANEOUS | Status: DC
  Administered 2013-04-08 – 2013-04-09 (×2): 6 via TOPICAL

## 2013-04-08 NOTE — Progress Notes (Signed)
ANTICOAGULATION CONSULT NOTE - Follow Up Consult  Pharmacy Consult for heparin Indication: ACS / 3V CAD / LV thrombus No Known Allergies  Patient Measurements: Height: 5\' 3"  (160 cm) Weight: 135 lb 5.8 oz (61.4 kg) IBW/kg (Calculated) : 52.4 Heparin Dosing Weight: 60 kg  Vital Signs: Temp: 97.8 F (36.6 C) (11/19 0749) Temp src: Oral (11/19 0749) BP: 101/39 mmHg (11/19 0749)  Labs:  Recent Labs  04/06/13 0540 04/07/13 0500 04/08/13 0720  HGB 13.1 13.6 12.7  HCT 38.4 39.3 37.5  PLT 230 235 238  HEPARINUNFRC 0.65 0.75* 0.71*  CREATININE 0.41*  --   --     Estimated Creatinine Clearance: 61.9 ml/min (by C-G formula based on Cr of 0.41).  Assessment: Patient is a 62 y.o F s/p cath on 11/14 with noted 3V CAD with plan for  CABG .   Events: TCTS eval for CABG in process.  Anticoag: ACS & apical thrombus:   Heparin level 0.71 just above goal at drip rate on 1200 uts/hr.  CBC stable, no bleeding noted.  Goal of Therapy:  Heparin level 0.3-0.7 units/ml Monitor platelets by anticoagulation protocol: Yes   Plan:  1) Decrease heparin drip to 1100 units/hr 2) Daily cbc and heparin level 3) F/u plan for CABG.  Thank you for allowing pharmacy to be a part of this patients care team.  Tad Moore, BCPS  Clinical Pharmacist Pager [phone]  04/08/2013 9:36 AM

## 2013-04-08 NOTE — Progress Notes (Signed)
CARDIAC REHAB PHASE I   PRE:  Rate/Rhythm: 65 SR  BP:  Supine:   Sitting: 91/45  Standing:    SaO2: 97 RA  MODE:  Ambulation: 1050 ft   POST:  Rate/Rhythm: 79 SR  BP:  Supine:   Sitting: 130/27  Standing:    SaO2: 100 RA 1050-1115 Assisted X 1 to ambulate to, I pushed IV pole. Gait steady. VS stable Pt back to recliner after walk with call light in reach. Pt without c/o of cp or SOB with walking.  Melina Copa RN 04/08/2013 11:12 AM

## 2013-04-08 NOTE — Progress Notes (Signed)
Patient ID: [name] [name], female   DOB: 1951-01-18, 62 y.o.   MRN: [id number]     Subjective:  No complaints spoke to brother [name] [name] on phone Dr [name] [name] to discuss surgery between 9:00-9:30  Objective:  Vital Signs in the last 24 hours: Temp:  [97.6 F (36.4 C)-98.7 F (37.1 C)] 97.8 F (36.6 C) (11/19 0749) Resp:  [16-18] 18 (11/19 0749) BP: (98-122)/(39-54) 101/39 mmHg (11/19 0749) SpO2:  [93 %-97 %] 95 % (11/19 0749)  Intake/Output from previous day: 11/18 0701 - 11/19 0700 In: 1221.7 [P.O.:690; I.V.:531.7] Out: -  Intake/Output from this shift: Total I/O In: 262 [P.O.:240; I.V.:22] Out: -   . aspirin EC  81 mg Oral Daily  . atorvastatin  80 mg Oral q1800  . insulin aspart  0-15 Units Subcutaneous TID WC  . insulin aspart  0-5 Units Subcutaneous QHS  . insulin glargine  14 Units Subcutaneous QHS  . phenytoin  100 mg Oral QHS  . sodium chloride  3 mL Intravenous Q12H  . spironolactone  12.5 mg Oral Daily   . sodium chloride 10 mL/hr (04/07/13 0900)  . heparin 1,200 Units/hr (04/07/13 1600)    Physical Exam: The patient appears to be in no distress.  Head and neck exam reveals that the pupils are equal and reactive.  The extraocular movements are full.  There is no scleral icterus.  Mouth and pharynx are benign.  No lymphadenopathy.  No carotid bruits.  The jugular venous pressure is normal.  Thyroid is not enlarged or tender.  Chest is clear to percussion and auscultation.  No rales or rhonchi.  Expansion of the chest is symmetrical.  Heart reveals no abnormal lift or heave.  First and second heart sounds are normal.  There is no murmur gallop rub or click.  The abdomen is soft and nontender.  Bowel sounds are normoactive.  There is no hepatosplenomegaly or mass.  There are no abdominal bruits.  Extremities reveal no phlebitis or edema.    There is no cyanosis or clubbing.  Neurologic exam is normal strength and no lateralizing weakness.  No sensory  deficits.  Integument reveals no rash  Lab Results:  Recent Labs  04/06/13 0540 04/07/13 0500  WBC 5.1 5.6  HGB 13.1 13.6  PLT 230 235    Recent Labs  04/06/13 0540  NA 136  K 3.8  CL 101  CO2 23  GLUCOSE 188*  BUN 7  CREATININE 0.41*   No results found for this basename: TROPONINI, CK, MB,  in the last 72 hours Hepatic Function Panel No results found for this basename: PROT, ALBUMIN, AST, ALT, ALKPHOS, BILITOT, BILIDIR, IBILI,  in the last 72 hours No results found for this basename: CHOL,  in the last 72 hours No results found for this basename: PROTIME,  in the last 72 hours  Imaging: MRI with viability other than apex   Cardiac Studies: Telemetry shows NSR Assessment/Plan:  Ischemic DCM:  Non revascularizable other than surgery.  MRI with viability.  ? Stitch procedure to eliminate apex She has some mental retardation or such Lives independantly.  No acute memory issues.  Plan per CVTS but CABG would appear to be best option despite comorbidities  Dr [name] [name] to see this am  Continue heparin   LOS: 5 days    [name] [name] 04/08/2013, 8:23 AM

## 2013-04-08 NOTE — Progress Notes (Signed)
04/08/2013 1000  IS taught to pt with return demonstration.  Up to 1200 achieved.  Casimir Barcellos, Linnell Fulling

## 2013-04-09 ENCOUNTER — Inpatient Hospital Stay (HOSPITAL_COMMUNITY): Payer: Medicaid Other

## 2013-04-09 LAB — URINE MICROSCOPIC-ADD ON

## 2013-04-09 LAB — CBC
HCT: 40 % (ref 36.0–46.0)
MCV: 93.2 fL (ref 78.0–100.0)
RDW: 12.7 % (ref 11.5–15.5)
WBC: 5.8 10*3/uL (ref 4.0–10.5)

## 2013-04-09 LAB — HEPARIN LEVEL (UNFRACTIONATED)
Heparin Unfractionated: 0.2 IU/mL — ABNORMAL LOW (ref 0.30–0.70)
Heparin Unfractionated: 0.41 IU/mL (ref 0.30–0.70)

## 2013-04-09 LAB — URINALYSIS, ROUTINE W REFLEX MICROSCOPIC
Bilirubin Urine: NEGATIVE
Glucose, UA: 500 mg/dL — AB
Ketones, ur: NEGATIVE mg/dL
Nitrite: POSITIVE — AB
pH: 5.5 (ref 5.0–8.0)

## 2013-04-09 LAB — GLUCOSE, CAPILLARY
Glucose-Capillary: 158 mg/dL — ABNORMAL HIGH (ref 70–99)
Glucose-Capillary: 177 mg/dL — ABNORMAL HIGH (ref 70–99)
Glucose-Capillary: 151 mg/dL — ABNORMAL HIGH (ref 70–99)
Glucose-Capillary: 127 mg/dL — ABNORMAL HIGH (ref 70–99)

## 2013-04-09 LAB — TYPE AND SCREEN

## 2013-04-09 MED ORDER — POTASSIUM CHLORIDE 2 MEQ/ML IV SOLN
80.0000 meq | INTRAVENOUS | Status: DC
  Filled 2013-04-09: qty 40

## 2013-04-09 MED ORDER — NITROGLYCERIN IN D5W 200-5 MCG/ML-% IV SOLN
2.0000 ug/min | INTRAVENOUS | Status: AC
  Administered 2013-04-10: 5 ug/min via INTRAVENOUS
  Filled 2013-04-09: qty 250

## 2013-04-09 MED ORDER — VANCOMYCIN HCL 10 G IV SOLR
1250.0000 mg | INTRAVENOUS | Status: AC
  Administered 2013-04-10: 1250 mg via INTRAVENOUS
  Filled 2013-04-09: qty 1250

## 2013-04-09 MED ORDER — PHENYLEPHRINE HCL 10 MG/ML IJ SOLN
30.0000 ug/min | INTRAVENOUS | Status: AC
  Administered 2013-04-10: 20 ug/min via INTRAVENOUS
  Filled 2013-04-09: qty 2

## 2013-04-09 MED ORDER — METOPROLOL TARTRATE 12.5 MG HALF TABLET
12.5000 mg | ORAL_TABLET | Freq: Once | ORAL | Status: AC
  Administered 2013-04-10: 12.5 mg via ORAL
  Filled 2013-04-09: qty 1

## 2013-04-09 MED ORDER — SODIUM CHLORIDE 0.9 % IV SOLN
INTRAVENOUS | Status: DC
  Filled 2013-04-09: qty 30

## 2013-04-09 MED ORDER — MAGNESIUM SULFATE 50 % IJ SOLN
40.0000 meq | INTRAMUSCULAR | Status: DC
  Filled 2013-04-09: qty 10

## 2013-04-09 MED ORDER — SODIUM CHLORIDE 0.9 % IV SOLN
INTRAVENOUS | Status: AC
  Administered 2013-04-10: 3.4 [IU]/h via INTRAVENOUS
  Filled 2013-04-09: qty 1

## 2013-04-09 MED ORDER — DEXTROSE 5 % IV SOLN
750.0000 mg | INTRAVENOUS | Status: DC
  Filled 2013-04-09: qty 750

## 2013-04-09 MED ORDER — PLASMA-LYTE 148 IV SOLN
INTRAVENOUS | Status: AC
  Administered 2013-04-10: 10:00:00
  Filled 2013-04-09: qty 2.5

## 2013-04-09 MED ORDER — DEXTROSE 5 % IV SOLN
1.5000 g | INTRAVENOUS | Status: AC
  Administered 2013-04-10: .75 g via INTRAVENOUS
  Administered 2013-04-10: 1.5 g via INTRAVENOUS
  Filled 2013-04-09: qty 1.5

## 2013-04-09 MED ORDER — PHENYTOIN SODIUM EXTENDED 100 MG PO CAPS
300.0000 mg | ORAL_CAPSULE | Freq: Every day | ORAL | Status: DC
  Administered 2013-04-09 – 2013-04-14 (×6): 300 mg via ORAL
  Filled 2013-04-09 (×8): qty 3

## 2013-04-09 MED ORDER — HEPARIN (PORCINE) IN NACL 100-0.45 UNIT/ML-% IJ SOLN
1150.0000 [IU]/h | INTRAMUSCULAR | Status: DC
  Filled 2013-04-09 (×2): qty 250

## 2013-04-09 MED ORDER — EPINEPHRINE HCL 1 MG/ML IJ SOLN
0.5000 ug/min | INTRAVENOUS | Status: DC
  Filled 2013-04-09: qty 4

## 2013-04-09 MED ORDER — TEMAZEPAM 15 MG PO CAPS
15.0000 mg | ORAL_CAPSULE | Freq: Once | ORAL | Status: AC
  Administered 2013-04-09: 15 mg via ORAL
  Filled 2013-04-09: qty 1

## 2013-04-09 MED ORDER — DEXMEDETOMIDINE HCL IN NACL 400 MCG/100ML IV SOLN
0.1000 ug/kg/h | INTRAVENOUS | Status: AC
  Administered 2013-04-10: .3 ug/kg/h via INTRAVENOUS
  Filled 2013-04-09: qty 100

## 2013-04-09 MED ORDER — AMINOCAPROIC ACID 250 MG/ML IV SOLN
INTRAVENOUS | Status: AC
  Administered 2013-04-10: 70 mL/h via INTRAVENOUS
  Filled 2013-04-09: qty 40

## 2013-04-09 MED ORDER — DOPAMINE-DEXTROSE 3.2-5 MG/ML-% IV SOLN
2.0000 ug/kg/min | INTRAVENOUS | Status: DC
  Filled 2013-04-09: qty 250

## 2013-04-09 MED ORDER — BISACODYL 5 MG PO TBEC: 5.0000 mg | DELAYED_RELEASE_TABLET | Freq: Once | ORAL | Status: DC

## 2013-04-09 NOTE — Progress Notes (Signed)
Patient ID: [name] [name], female   DOB: 1950/07/16, 62 y.o.   MRN: [id number]  I met with the patient and family yesterday morning to discuss CABG surgery option and answer their questions. I think it is the only option to try to help her although at higher risk and she will have a poor prognosis long term if she does not get her risk factors under control. I discussed the operative procedure with the patient and family including alternatives, benefits and risks; including but not limited to bleeding, blood transfusion, infection, stroke, myocardial infarction, graft failure, heart block requiring a permanent pacemaker, organ dysfunction, and death.  [name] [name] understands and agrees to proceed.  We will schedule surgery for Friday am.

## 2013-04-09 NOTE — Progress Notes (Signed)
Patient ID: [name] [name], female   DOB: [birth year], 62 y.o.   MRN: [id number]     Subjective:  No complaints  No chest pain   Objective:  Vital Signs in the last 24 hours: Temp:  [98.1 F (36.7 C)-98.6 F (37 C)] 98.3 F (36.8 C) (11/20 0445) Resp:  [20] 20 (11/19 1100) BP: (91-130)/(27-55) 111/46 mmHg (11/20 0442) SpO2:  [95 %-97 %] 95 % (11/19 2309)  Intake/Output from previous day: 11/19 0701 - 11/20 0700 In: 1424.2 [P.O.:960; I.V.:464.2] Out: -  Intake/Output from this shift:    . aspirin EC  81 mg Oral Daily  . atorvastatin  80 mg Oral q1800  . Chlorhexidine Gluconate Cloth  6 each Topical Daily  . insulin aspart  0-15 Units Subcutaneous TID WC  . insulin aspart  0-5 Units Subcutaneous QHS  . insulin glargine  14 Units Subcutaneous QHS  . mupirocin ointment  1 application Nasal BID  . phenytoin  100 mg Oral QHS  . sodium chloride  3 mL Intravenous Q12H  . spironolactone  12.5 mg Oral Daily   . sodium chloride 10 mL/hr at 04/08/13 1026  . heparin 1,100 Units/hr (04/08/13 1026)    Physical Exam: The patient appears to be in no distress.  Head and neck exam reveals that the pupils are equal and reactive.  The extraocular movements are full.  There is no scleral icterus.  Mouth and pharynx are benign.  No lymphadenopathy.  No carotid bruits.  The jugular venous pressure is normal.  Thyroid is not enlarged or tender.  Chest is clear to percussion and auscultation.  No rales or rhonchi.  Expansion of the chest is symmetrical.  Heart reveals no abnormal lift or heave.  First and second heart sounds are normal.  There is no murmur gallop rub or click.  The abdomen is soft and nontender.  Bowel sounds are normoactive.  There is no hepatosplenomegaly or mass.  There are no abdominal bruits.  Extremities reveal no phlebitis or edema.    There is no cyanosis or clubbing.  Neurologic exam is normal strength and no lateralizing weakness.  No sensory deficits.  Integument  reveals no rash  Lab Results:  Recent Labs  04/08/13 0720 04/09/13 0440  WBC 4.4 5.8  HGB 12.7 13.8  PLT 238 227    Imaging: MRI with viability other than apex   Cardiac Studies: Telemetry shows NSR Assessment/Plan:  Ischemic DCM:  Non revascularizable other than surgery.  MRI with viability.  ? Stitch procedure to eliminate apex She has some mental retardation or such Lives independantly.  No acute memory issues.  Plan per CVTS but CABG would appear to be best option despite comorbidities  CABG Friday   LOS: 6 days    [name] 04/09/2013, 7:49 AM

## 2013-04-09 NOTE — Progress Notes (Addendum)
ANTICOAGULATION CONSULT NOTE - Follow Up Consult  Pharmacy Consult for heparin Indication: ACS / 3V CAD / LV thrombus No Known Allergies  Patient Measurements: Height: 5\' 3"  (160 cm) Weight: 135 lb 5.8 oz (61.4 kg) IBW/kg (Calculated) : 52.4 Heparin Dosing Weight: 60 kg  Vital Signs: Temp: 98.3 F (36.8 C) (11/20 1144) Temp src: Oral (11/20 1144) BP: 113/44 mmHg (11/20 1200) Pulse Rate: 58 (11/20 1144)  Labs:  Recent Labs  04/07/13 0500 04/08/13 0720 04/09/13 0440 04/09/13 1043  HGB 13.6 12.7 13.8  --   HCT 39.3 37.5 40.0  --   PLT 235 238 227  --   HEPARINUNFRC 0.75* 0.71* <0.10* 0.20*    Estimated Creatinine Clearance: 61.9 ml/min (by C-G formula based on Cr of 0.41).  Assessment: Patient is a 62 y.o F s/p cath on 11/14 with noted 3V CAD with plan for  CABG .   Anticoag: ACS & apical thrombus:   Heparin level < 0.1 this AM due to IV being disconnected by patient.  Restarted, and heparin level ~ 5 hrs later subtherapeutic at 0.2.  No bleeding or complications noted, CBC stable.  Goal of Therapy:  Heparin level 0.3-0.7 units/ml Monitor platelets by anticoagulation protocol: Yes   Plan:  1) Increase heparin drip to 1150 units/hr 2) Recheck heparin level ~ 8PM. 3) Heparin off in AM prior to CABG.  Thank you for allowing pharmacy to be a part of this patients care team.  Tad Moore, BCPS  Clinical Pharmacist Pager [phone]  04/09/2013 2:46 PM    Addendum: 2130: HL therapeutic at 0.41. Per RN, no bleeding noted. Heparin line infusing well.  Plan:  1) Continue heparin at 1150 units/hr  2) Plan to turn off heparin prior to OR in AM.   Vinnie Level, PharmD.  Clinical Pharmacist Pager [phone]

## 2013-04-09 NOTE — Progress Notes (Signed)
CARDIAC REHAB PHASE I   PRE:  Rate/Rhythm: 60 SR    BP: sitting 104/37    SaO2:   MODE:  Ambulation: 1860 ft   POST:  Rate/Rhythm: 75 SR    BP: sitting 113/44     SaO2:   Tolerated well. Steady, quick. No CP. Pt knows she is having surgery tomorrow and that she will need 24/7 supervision upon d/c. Could not recall sternal precautions. Gave reminders to practice IS, which she is doing about 750 ml. [id number]   Elissa Lovett [place] CES, ACSM 04/09/2013 12:07 PM

## 2013-04-10 ENCOUNTER — Inpatient Hospital Stay (HOSPITAL_COMMUNITY): Payer: Medicaid Other | Admitting: Certified Registered"

## 2013-04-10 ENCOUNTER — Inpatient Hospital Stay (HOSPITAL_COMMUNITY): Payer: Medicaid Other

## 2013-04-10 ENCOUNTER — Encounter (HOSPITAL_COMMUNITY)
Admission: AD | Disposition: A | Discharge status: Home / Self Care | Payer: Medicaid Other | Source: Other Acute Inpatient Hospital | Attending: Cardiovascular Disease

## 2013-04-10 ENCOUNTER — Encounter (HOSPITAL_COMMUNITY): Payer: Medicaid Other | Admitting: Certified Registered"

## 2013-04-10 DIAGNOSIS — I2510 Atherosclerotic heart disease of native coronary artery without angina pectoris: Secondary | ICD-10-CM

## 2013-04-10 HISTORY — PX: INTRAOPERATIVE TRANSESOPHAGEAL ECHOCARDIOGRAM: SHX5062

## 2013-04-10 HISTORY — PX: CORONARY ARTERY BYPASS GRAFT: SHX141

## 2013-04-10 LAB — POCT I-STAT 4, (NA,K, GLUC, HGB,HCT)
Glucose, Bld: 172 mg/dL — ABNORMAL HIGH (ref 70–99)
Glucose, Bld: 148 mg/dL — ABNORMAL HIGH (ref 70–99)
Glucose, Bld: 123 mg/dL — ABNORMAL HIGH (ref 70–99)
Glucose, Bld: 128 mg/dL — ABNORMAL HIGH (ref 70–99)
Glucose, Bld: 148 mg/dL — ABNORMAL HIGH (ref 70–99)
HCT: 35 % — ABNORMAL LOW (ref 36.0–46.0)
HCT: 33 % — ABNORMAL LOW (ref 36.0–46.0)
HCT: 26 % — ABNORMAL LOW (ref 36.0–46.0)
Hemoglobin: 11.2 g/dL — ABNORMAL LOW (ref 12.0–15.0)
Hemoglobin: 8.8 g/dL — ABNORMAL LOW (ref 12.0–15.0)
Hemoglobin: 11.2 g/dL — ABNORMAL LOW (ref 12.0–15.0)
Potassium: 3.3 mEq/L — ABNORMAL LOW (ref 3.5–5.1)
Potassium: 3.1 mEq/L — ABNORMAL LOW (ref 3.5–5.1)
Potassium: 4.5 mEq/L (ref 3.5–5.1)
Sodium: 140 mEq/L (ref 135–145)
Sodium: 143 mEq/L (ref 135–145)
Sodium: 139 mEq/L (ref 135–145)
Sodium: 138 mEq/L (ref 135–145)
Sodium: 141 mEq/L (ref 135–145)
Sodium: 143 mEq/L (ref 135–145)

## 2013-04-10 LAB — COMPREHENSIVE METABOLIC PANEL
AST: 30 U/L (ref 0–37)
Albumin: 3.4 g/dL — ABNORMAL LOW (ref 3.5–5.2)
Alkaline Phosphatase: 136 U/L — ABNORMAL HIGH (ref 39–117)
BUN: 8 mg/dL (ref 6–23)
Chloride: 104 mEq/L (ref 96–112)
Creatinine, Ser: 0.51 mg/dL (ref 0.50–1.10)
GFR calc non Af Amer: >90 mL/min (ref >90)
Glucose, Bld: 144 mg/dL — ABNORMAL HIGH (ref 70–99)
Potassium: 3.2 mEq/L — ABNORMAL LOW (ref 3.5–5.1)
Sodium: 140 mEq/L (ref 135–145)
Total Bilirubin: 0.3 mg/dL (ref 0.3–1.2)

## 2013-04-10 LAB — POCT I-STAT, CHEM 8
BUN: 4 mg/dL — ABNORMAL LOW (ref 6–23)
Creatinine, Ser: 0.5 mg/dL (ref 0.50–1.10)
Sodium: 144 mEq/L (ref 135–145)
TCO2: 23 mmol/L (ref 0–100)

## 2013-04-10 LAB — POCT I-STAT 3, ART BLOOD GAS (G3+)
Acid-base deficit: 1 mmol/L (ref 0.0–2.0)
Acid-base deficit: 1 mmol/L (ref 0.0–2.0)
Acid-base deficit: 2 mmol/L (ref 0.0–2.0)
Acid-base deficit: 1 mmol/L (ref 0.0–2.0)
Acid-base deficit: 1 mmol/L (ref 0.0–2.0)
Bicarbonate: 24.3 mEq/L — ABNORMAL HIGH (ref 20.0–24.0)
O2 Saturation: 100 %
O2 Saturation: 100 %
O2 Saturation: 98 %
O2 Saturation: 99 %
O2 Saturation: 97 %
Patient temperature: 38
Patient temperature: 38.3
Patient temperature: 38.3
TCO2: 25 mmol/L (ref 0–100)
TCO2: 25 mmol/L (ref 0–100)
TCO2: 26 mmol/L (ref 0–100)
pCO2 arterial: 42 mmHg (ref 35.0–45.0)
pCO2 arterial: 47.7 mmHg — ABNORMAL HIGH (ref 35.0–45.0)
pCO2 arterial: 38.4 mmHg (ref 35.0–45.0)
pCO2 arterial: 48.7 mmHg — ABNORMAL HIGH (ref 35.0–45.0)
pH, Arterial: 7.311 — ABNORMAL LOW (ref 7.350–7.450)
pO2, Arterial: 206 mmHg — ABNORMAL HIGH (ref 80.0–100.0)
pO2, Arterial: 100 mmHg (ref 80.0–100.0)
pO2, Arterial: 154 mmHg — ABNORMAL HIGH (ref 80.0–100.0)
pO2, Arterial: 101 mmHg — ABNORMAL HIGH (ref 80.0–100.0)

## 2013-04-10 LAB — CBC
HCT: 32.7 % — ABNORMAL LOW (ref 36.0–46.0)
HCT: 32.7 % — ABNORMAL LOW (ref 36.0–46.0)
MCH: 32.3 pg (ref 26.0–34.0)
MCH: 32.1 pg (ref 26.0–34.0)
MCHC: 34.3 g/dL (ref 30.0–36.0)
MCV: 92.9 fL (ref 78.0–100.0)
Platelets: 240 10*3/uL (ref 150–400)
Platelets: 146 10*3/uL — ABNORMAL LOW (ref 150–400)
Platelets: 176 10*3/uL (ref 150–400)
RBC: 4.06 MIL/uL (ref 3.87–5.11)
RBC: 3.52 MIL/uL — ABNORMAL LOW (ref 3.87–5.11)
RBC: 3.49 MIL/uL — ABNORMAL LOW (ref 3.87–5.11)
RDW: 12.9 % (ref 11.5–15.5)
RDW: 12.9 % (ref 11.5–15.5)
RDW: 13 % (ref 11.5–15.5)
WBC: 6.5 10*3/uL (ref 4.0–10.5)
WBC: 6.7 10*3/uL (ref 4.0–10.5)

## 2013-04-10 LAB — CREATININE, SERUM: GFR calc Af Amer: >90 mL/min (ref >90)

## 2013-04-10 LAB — HEMOGLOBIN AND HEMATOCRIT, BLOOD
HCT: 27.4 % — ABNORMAL LOW (ref 36.0–46.0)
Hemoglobin: 9.5 g/dL — ABNORMAL LOW (ref 12.0–15.0)

## 2013-04-10 LAB — GLUCOSE, CAPILLARY
Glucose-Capillary: 79 mg/dL (ref 70–99)
Glucose-Capillary: 100 mg/dL — ABNORMAL HIGH (ref 70–99)
Glucose-Capillary: 150 mg/dL — ABNORMAL HIGH (ref 70–99)
Glucose-Capillary: 115 mg/dL — ABNORMAL HIGH (ref 70–99)
Glucose-Capillary: 112 mg/dL — ABNORMAL HIGH (ref 70–99)

## 2013-04-10 LAB — PROTIME-INR: INR: 1.4 (ref 0.00–1.49)

## 2013-04-10 LAB — MAGNESIUM: Magnesium: 3.5 mg/dL — ABNORMAL HIGH (ref 1.5–2.5)

## 2013-04-10 LAB — APTT: aPTT: 34 seconds (ref 24–37)

## 2013-04-10 SURGERY — CORONARY ARTERY BYPASS GRAFTING (CABG)
Anesthesia: General | Site: Chest | Wound class: Clean

## 2013-04-10 MED ORDER — MAGNESIUM SULFATE 40 MG/ML IJ SOLN
4.0000 g | Freq: Once | INTRAMUSCULAR | Status: AC
  Administered 2013-04-10: 4 g via INTRAVENOUS
  Filled 2013-04-10: qty 100

## 2013-04-10 MED ORDER — METOCLOPRAMIDE HCL 5 MG/ML IJ SOLN
10.0000 mg | Freq: Four times a day (QID) | INTRAMUSCULAR | Status: AC
  Administered 2013-04-10 – 2013-04-11 (×4): 10 mg via INTRAVENOUS
  Filled 2013-04-10 (×4): qty 2

## 2013-04-10 MED ORDER — HEMOSTATIC AGENTS (NO CHARGE) OPTIME
TOPICAL | Status: DC | PRN
  Administered 2013-04-10: 1 via TOPICAL

## 2013-04-10 MED ORDER — METOPROLOL TARTRATE 12.5 MG HALF TABLET
12.5000 mg | ORAL_TABLET | Freq: Two times a day (BID) | ORAL | Status: DC
  Administered 2013-04-11 – 2013-04-13 (×4): 12.5 mg via ORAL
  Filled 2013-04-10 (×7): qty 1

## 2013-04-10 MED ORDER — BISACODYL 5 MG PO TBEC
10.0000 mg | DELAYED_RELEASE_TABLET | Freq: Every day | ORAL | Status: DC
  Administered 2013-04-11 – 2013-04-12 (×2): 10 mg via ORAL
  Filled 2013-04-10 (×2): qty 2

## 2013-04-10 MED ORDER — ACETAMINOPHEN 500 MG PO TABS
1000.0000 mg | ORAL_TABLET | Freq: Four times a day (QID) | ORAL | Status: DC
  Administered 2013-04-11 – 2013-04-13 (×10): 1000 mg via ORAL
  Filled 2013-04-10 (×14): qty 2

## 2013-04-10 MED ORDER — ACETAMINOPHEN 650 MG RE SUPP
650.0000 mg | Freq: Once | RECTAL | Status: AC
  Administered 2013-04-10: 650 mg via RECTAL

## 2013-04-10 MED ORDER — ALBUMIN HUMAN 5 % IV SOLN
INTRAVENOUS | Status: DC | PRN
  Administered 2013-04-10: 11:00:00 via INTRAVENOUS

## 2013-04-10 MED ORDER — ONDANSETRON HCL 4 MG/2ML IJ SOLN: 4.0000 mg | Freq: Four times a day (QID) | INTRAMUSCULAR | Status: DC | PRN

## 2013-04-10 MED ORDER — SODIUM CHLORIDE 0.9 % IV SOLN: 250.0000 mL | INTRAVENOUS | Status: DC

## 2013-04-10 MED ORDER — OXYCODONE HCL 5 MG PO TABS
5.0000 mg | ORAL_TABLET | ORAL | Status: DC | PRN
  Administered 2013-04-11: 5 mg via ORAL
  Administered 2013-04-11 (×2): 10 mg via ORAL
  Administered 2013-04-11: 5 mg via ORAL
  Administered 2013-04-11: 10 mg via ORAL
  Administered 2013-04-11: 5 mg via ORAL
  Administered 2013-04-11 – 2013-04-12 (×2): 10 mg via ORAL
  Filled 2013-04-10: qty 1
  Filled 2013-04-10 (×3): qty 2
  Filled 2013-04-10: qty 1
  Filled 2013-04-10: qty 2
  Filled 2013-04-10: qty 1
  Filled 2013-04-10: qty 2

## 2013-04-10 MED ORDER — LACTATED RINGERS IV SOLN
INTRAVENOUS | Status: DC | PRN
  Administered 2013-04-10 (×2): via INTRAVENOUS

## 2013-04-10 MED ORDER — MORPHINE SULFATE 2 MG/ML IJ SOLN
1.0000 mg | INTRAMUSCULAR | Status: AC | PRN
  Filled 2013-04-10 (×2): qty 1

## 2013-04-10 MED ORDER — POTASSIUM CHLORIDE 10 MEQ/50ML IV SOLN
10.0000 meq | INTRAVENOUS | Status: AC
  Administered 2013-04-10 (×3): 10 meq via INTRAVENOUS

## 2013-04-10 MED ORDER — HYDROMORPHONE HCL PF 1 MG/ML IJ SOLN: 0.2500 mg | INTRAMUSCULAR | Status: DC | PRN

## 2013-04-10 MED ORDER — SODIUM CHLORIDE 0.45 % IV SOLN
INTRAVENOUS | Status: DC
  Administered 2013-04-10: 1 mL via INTRAVENOUS

## 2013-04-10 MED ORDER — ASPIRIN EC 325 MG PO TBEC
325.0000 mg | DELAYED_RELEASE_TABLET | Freq: Every day | ORAL | Status: DC
  Administered 2013-04-11 – 2013-04-13 (×3): 325 mg via ORAL
  Filled 2013-04-10 (×3): qty 1

## 2013-04-10 MED ORDER — HEPARIN SODIUM (PORCINE) 1000 UNIT/ML IJ SOLN
INTRAMUSCULAR | Status: DC | PRN
  Administered 2013-04-10: 20000 [IU] via INTRAVENOUS

## 2013-04-10 MED ORDER — NITROGLYCERIN IN D5W 200-5 MCG/ML-% IV SOLN
0.0000 ug/min | INTRAVENOUS | Status: DC
  Administered 2013-04-10: 0 ug/min via INTRAVENOUS

## 2013-04-10 MED ORDER — PANTOPRAZOLE SODIUM 40 MG PO TBEC
40.0000 mg | DELAYED_RELEASE_TABLET | Freq: Every day | ORAL | Status: DC
  Administered 2013-04-12 – 2013-04-13 (×2): 40 mg via ORAL
  Filled 2013-04-10 (×2): qty 1

## 2013-04-10 MED ORDER — SODIUM CHLORIDE 0.9 % IV SOLN
INTRAVENOUS | Status: DC
  Administered 2013-04-10: 1 mL via INTRAVENOUS

## 2013-04-10 MED ORDER — MORPHINE SULFATE 2 MG/ML IJ SOLN
2.0000 mg | INTRAMUSCULAR | Status: DC | PRN
  Administered 2013-04-10 – 2013-04-11 (×9): 2 mg via INTRAVENOUS
  Administered 2013-04-12 – 2013-04-13 (×5): 4 mg via INTRAVENOUS
  Filled 2013-04-10 (×2): qty 2
  Filled 2013-04-10: qty 1
  Filled 2013-04-10 (×2): qty 2
  Filled 2013-04-10 (×4): qty 1
  Filled 2013-04-10: qty 2
  Filled 2013-04-10: qty 1

## 2013-04-10 MED ORDER — DOPAMINE-DEXTROSE 1.6-5 MG/ML-% IV SOLN
INTRAVENOUS | Status: DC | PRN
  Administered 2013-04-10: 3 ug/kg/min via INTRAVENOUS

## 2013-04-10 MED ORDER — ASPIRIN 81 MG PO CHEW: 324.0000 mg | CHEWABLE_TABLET | Freq: Every day | ORAL | Status: DC

## 2013-04-10 MED ORDER — DOPAMINE-DEXTROSE 3.2-5 MG/ML-% IV SOLN
0.0000 ug/kg/min | INTRAVENOUS | Status: DC
  Administered 2013-04-10: 2 ug/kg/min via INTRAVENOUS

## 2013-04-10 MED ORDER — 0.9 % SODIUM CHLORIDE (POUR BTL) OPTIME
TOPICAL | Status: DC | PRN
  Administered 2013-04-10: 1000 mL

## 2013-04-10 MED ORDER — PROPOFOL 10 MG/ML IV BOLUS
INTRAVENOUS | Status: DC | PRN
  Administered 2013-04-10: 50 mg via INTRAVENOUS

## 2013-04-10 MED ORDER — MORPHINE SULFATE 2 MG/ML IJ SOLN
INTRAMUSCULAR | Status: AC
  Administered 2013-04-10: 2 mg via INTRAVENOUS
  Filled 2013-04-10: qty 1

## 2013-04-10 MED ORDER — MIDAZOLAM HCL 2 MG/2ML IJ SOLN
2.0000 mg | INTRAMUSCULAR | Status: DC | PRN
  Administered 2013-04-10: 2 mg via INTRAVENOUS

## 2013-04-10 MED ORDER — CEFUROXIME SODIUM 1.5 G IJ SOLR
1.5000 g | Freq: Two times a day (BID) | INTRAMUSCULAR | Status: AC
  Administered 2013-04-10 – 2013-04-12 (×4): 1.5 g via INTRAVENOUS
  Filled 2013-04-10 (×4): qty 1.5

## 2013-04-10 MED ORDER — LACTATED RINGERS IV SOLN
INTRAVENOUS | Status: DC
  Administered 2013-04-10: 1 mL via INTRAVENOUS

## 2013-04-10 MED ORDER — DOCUSATE SODIUM 100 MG PO CAPS
200.0000 mg | ORAL_CAPSULE | Freq: Every day | ORAL | Status: DC
  Administered 2013-04-11 – 2013-04-12 (×2): 200 mg via ORAL
  Filled 2013-04-10 (×2): qty 2

## 2013-04-10 MED ORDER — SODIUM CHLORIDE 0.9 % IJ SOLN: 3.0000 mL | INTRAMUSCULAR | Status: DC | PRN

## 2013-04-10 MED ORDER — MIDAZOLAM HCL 5 MG/5ML IJ SOLN
INTRAMUSCULAR | Status: DC | PRN
  Administered 2013-04-10: 1 mg via INTRAVENOUS
  Administered 2013-04-10: 4 mg via INTRAVENOUS
  Administered 2013-04-10: 2 mg via INTRAVENOUS
  Administered 2013-04-10: 3 mg via INTRAVENOUS

## 2013-04-10 MED ORDER — LACTATED RINGERS IV SOLN
INTRAVENOUS | Status: DC | PRN
  Administered 2013-04-10: 07:00:00 via INTRAVENOUS

## 2013-04-10 MED ORDER — ACETAMINOPHEN 160 MG/5ML PO SOLN: 650.0000 mg | Freq: Once | ORAL | Status: AC

## 2013-04-10 MED ORDER — METOPROLOL TARTRATE 25 MG/10 ML ORAL SUSPENSION
12.5000 mg | Freq: Two times a day (BID) | ORAL | Status: DC
  Filled 2013-04-10 (×7): qty 5

## 2013-04-10 MED ORDER — ROCURONIUM BROMIDE 100 MG/10ML IV SOLN
INTRAVENOUS | Status: DC | PRN
  Administered 2013-04-10: 30 mg via INTRAVENOUS
  Administered 2013-04-10: 50 mg via INTRAVENOUS
  Administered 2013-04-10 (×2): 20 mg via INTRAVENOUS
  Administered 2013-04-10: 50 mg via INTRAVENOUS
  Administered 2013-04-10: 20 mg via INTRAVENOUS

## 2013-04-10 MED ORDER — INSULIN REGULAR BOLUS VIA INFUSION
0.0000 [IU] | Freq: Three times a day (TID) | INTRAVENOUS | Status: DC
  Filled 2013-04-10: qty 10

## 2013-04-10 MED ORDER — BISACODYL 10 MG RE SUPP
10.0000 mg | Freq: Every day | RECTAL | Status: DC
Start: 2013-04-11 — End: 2013-04-13

## 2013-04-10 MED ORDER — LACTATED RINGERS IV SOLN: 500.0000 mL | Freq: Once | INTRAVENOUS | Status: AC | PRN

## 2013-04-10 MED ORDER — THROMBIN 20000 UNITS EX SOLR
CUTANEOUS | Status: AC
  Filled 2013-04-10: qty 20000

## 2013-04-10 MED ORDER — ONDANSETRON HCL 4 MG/2ML IJ SOLN: 4.0000 mg | Freq: Once | INTRAMUSCULAR | Status: AC | PRN

## 2013-04-10 MED ORDER — ALBUMIN HUMAN 5 % IV SOLN
250.0000 mL | INTRAVENOUS | Status: AC | PRN
  Administered 2013-04-10 – 2013-04-11 (×3): 250 mL via INTRAVENOUS
  Filled 2013-04-10: qty 250

## 2013-04-10 MED ORDER — FENTANYL CITRATE 0.05 MG/ML IJ SOLN
INTRAMUSCULAR | Status: DC | PRN
  Administered 2013-04-10: 250 ug via INTRAVENOUS
  Administered 2013-04-10: 400 ug via INTRAVENOUS
  Administered 2013-04-10: 300 ug via INTRAVENOUS
  Administered 2013-04-10: 50 ug via INTRAVENOUS

## 2013-04-10 MED ORDER — THROMBIN 20000 UNITS EX SOLR
OROMUCOSAL | Status: DC | PRN
  Administered 2013-04-10 (×3): via TOPICAL

## 2013-04-10 MED ORDER — DIPHENHYDRAMINE HCL 50 MG/ML IJ SOLN
INTRAMUSCULAR | Status: DC | PRN
  Administered 2013-04-10: 50 mg via INTRAVENOUS

## 2013-04-10 MED ORDER — PHENYLEPHRINE HCL 10 MG/ML IJ SOLN
0.0000 ug/min | INTRAVENOUS | Status: DC
  Administered 2013-04-10: 0 ug/min via INTRAVENOUS
  Filled 2013-04-10: qty 2

## 2013-04-10 MED ORDER — ACETAMINOPHEN 160 MG/5ML PO SOLN
1000.0000 mg | Freq: Four times a day (QID) | ORAL | Status: DC
  Filled 2013-04-10: qty 40

## 2013-04-10 MED ORDER — METOPROLOL TARTRATE 1 MG/ML IV SOLN: 2.5000 mg | INTRAVENOUS | Status: DC | PRN

## 2013-04-10 MED ORDER — MIDAZOLAM HCL 2 MG/2ML IJ SOLN
INTRAMUSCULAR | Status: AC
  Administered 2013-04-10: 2 mg via INTRAVENOUS
  Filled 2013-04-10: qty 2

## 2013-04-10 MED ORDER — VANCOMYCIN HCL IN DEXTROSE 1-5 GM/200ML-% IV SOLN
1000.0000 mg | Freq: Once | INTRAVENOUS | Status: AC
  Administered 2013-04-10: 1000 mg via INTRAVENOUS
  Filled 2013-04-10: qty 200

## 2013-04-10 MED ORDER — DEXMEDETOMIDINE HCL IN NACL 200 MCG/50ML IV SOLN
0.1000 ug/kg/h | INTRAVENOUS | Status: DC
  Administered 2013-04-10: 0.7 ug/kg/h via INTRAVENOUS
  Filled 2013-04-10: qty 50

## 2013-04-10 MED ORDER — SODIUM CHLORIDE 0.9 % IJ SOLN
3.0000 mL | Freq: Two times a day (BID) | INTRAMUSCULAR | Status: DC
  Administered 2013-04-11 – 2013-04-13 (×5): 3 mL via INTRAVENOUS

## 2013-04-10 MED ORDER — FAMOTIDINE IN NACL 20-0.9 MG/50ML-% IV SOLN
20.0000 mg | Freq: Two times a day (BID) | INTRAVENOUS | Status: AC
  Administered 2013-04-10 (×2): 20 mg via INTRAVENOUS
  Filled 2013-04-10: qty 50

## 2013-04-10 MED ORDER — SODIUM CHLORIDE 0.9 % IV SOLN
INTRAVENOUS | Status: DC
  Administered 2013-04-10: 4.4 [IU]/h via INTRAVENOUS
  Administered 2013-04-11: 1.5 [IU]/h via INTRAVENOUS
  Filled 2013-04-10 (×2): qty 1

## 2013-04-10 MED ORDER — PROTAMINE SULFATE 10 MG/ML IV SOLN
INTRAVENOUS | Status: DC | PRN
  Administered 2013-04-10: 180 mg via INTRAVENOUS

## 2013-04-10 SURGICAL SUPPLY — 105 items
ATTRACTOMAT 16X20 MAGNETIC DRP (DRAPES) ×3 IMPLANT
BAG DECANTER FOR FLEXI CONT (MISCELLANEOUS) ×3 IMPLANT
BANDAGE ELASTIC 4 VELCRO ST LF (GAUZE/BANDAGES/DRESSINGS) ×3 IMPLANT
BANDAGE ELASTIC 6 VELCRO ST LF (GAUZE/BANDAGES/DRESSINGS) ×3 IMPLANT
BANDAGE GAUZE ELAST BULKY 4 IN (GAUZE/BANDAGES/DRESSINGS) ×3 IMPLANT
BASKET HEART (ORDER IN 25'S) (MISCELLANEOUS) ×1
BASKET HEART (ORDER IN 25S) (MISCELLANEOUS) ×2 IMPLANT
BLADE STERNUM SYSTEM 6 (BLADE) ×3 IMPLANT
BLADE SURG 11 STRL SS (BLADE) ×3 IMPLANT
CANISTER SUCTION 2500CC (MISCELLANEOUS) ×3 IMPLANT
CANNULA VENOUS LOW PROF 34X46 (CANNULA) ×3 IMPLANT
CANNULA VESSEL 3MM BLUNT TIP (CANNULA) ×9 IMPLANT
CATH ROBINSON RED A/P 18FR (CATHETERS) ×6 IMPLANT
CATH THORACIC 28FR (CATHETERS) ×3 IMPLANT
CATH THORACIC 28FR RT ANG (CATHETERS) IMPLANT
CATH THORACIC 36FR (CATHETERS) ×3 IMPLANT
CATH THORACIC 36FR RT ANG (CATHETERS) ×3 IMPLANT
CLIP TI MEDIUM 24 (CLIP) IMPLANT
CLIP TI WIDE RED SMALL 24 (CLIP) IMPLANT
CONN Y 3/8X3/8X3/8  BEN (MISCELLANEOUS) ×1
CONN Y 3/8X3/8X3/8 BEN (MISCELLANEOUS) ×2 IMPLANT
COVER SURGICAL LIGHT HANDLE (MISCELLANEOUS) ×3 IMPLANT
CRADLE DONUT ADULT HEAD (MISCELLANEOUS) ×3 IMPLANT
DERMABOND ADVANCED (GAUZE/BANDAGES/DRESSINGS) ×1
DERMABOND ADVANCED .7 DNX12 (GAUZE/BANDAGES/DRESSINGS) ×2 IMPLANT
DRAPE CARDIOVASCULAR INCISE (DRAPES) ×1
DRAPE SLUSH/WARMER DISC (DRAPES) ×3 IMPLANT
DRAPE SRG 135X102X78XABS (DRAPES) ×2 IMPLANT
DRSG COVADERM 4X14 (GAUZE/BANDAGES/DRESSINGS) ×3 IMPLANT
ELECT CAUTERY BLADE 6.4 (BLADE) ×3 IMPLANT
ELECT REM PT RETURN 9FT ADLT (ELECTROSURGICAL) ×6
ELECTRODE REM PT RTRN 9FT ADLT (ELECTROSURGICAL) ×4 IMPLANT
GLOVE BIO SURGEON STRL SZ 6 (GLOVE) ×18 IMPLANT
GLOVE BIO SURGEON STRL SZ 6.5 (GLOVE) ×18 IMPLANT
GLOVE BIO SURGEON STRL SZ7 (GLOVE) IMPLANT
GLOVE BIO SURGEON STRL SZ7.5 (GLOVE) IMPLANT
GLOVE BIOGEL PI IND STRL 6 (GLOVE) IMPLANT
GLOVE BIOGEL PI IND STRL 6.5 (GLOVE) IMPLANT
GLOVE BIOGEL PI IND STRL 7.0 (GLOVE) ×6 IMPLANT
GLOVE BIOGEL PI INDICATOR 6 (GLOVE)
GLOVE BIOGEL PI INDICATOR 6.5 (GLOVE)
GLOVE BIOGEL PI INDICATOR 7.0 (GLOVE) ×3
GLOVE EUDERMIC 7 POWDERFREE (GLOVE) ×6 IMPLANT
GLOVE ORTHO TXT STRL SZ7.5 (GLOVE) IMPLANT
GOWN PREVENTION PLUS XLARGE (GOWN DISPOSABLE) ×3 IMPLANT
GOWN STRL NON-REIN LRG LVL3 (GOWN DISPOSABLE) ×27 IMPLANT
HEMOSTAT POWDER SURGIFOAM 1G (HEMOSTASIS) ×9 IMPLANT
HEMOSTAT SURGICEL 2X14 (HEMOSTASIS) ×3 IMPLANT
INSERT FOGARTY 61MM (MISCELLANEOUS) IMPLANT
INSERT FOGARTY XLG (MISCELLANEOUS) IMPLANT
KIT BASIN OR (CUSTOM PROCEDURE TRAY) ×3 IMPLANT
KIT CATH CPB BARTLE (MISCELLANEOUS) ×3 IMPLANT
KIT ROOM TURNOVER OR (KITS) ×3 IMPLANT
KIT SUCTION CATH 14FR (SUCTIONS) ×3 IMPLANT
KIT VASOVIEW W/TROCAR VH 2000 (KITS) ×3 IMPLANT
NS IRRIG 1000ML POUR BTL (IV SOLUTION) ×18 IMPLANT
PACK OPEN HEART (CUSTOM PROCEDURE TRAY) ×3 IMPLANT
PAD ARMBOARD 7.5X6 YLW CONV (MISCELLANEOUS) ×6 IMPLANT
PAD ELECT DEFIB RADIOL ZOLL (MISCELLANEOUS) ×3 IMPLANT
PENCIL BUTTON HOLSTER BLD 10FT (ELECTRODE) ×3 IMPLANT
PUNCH AORTIC ROTATE 4.0MM (MISCELLANEOUS) IMPLANT
PUNCH AORTIC ROTATE 4.5MM 8IN (MISCELLANEOUS) ×3 IMPLANT
PUNCH AORTIC ROTATE 5MM 8IN (MISCELLANEOUS) IMPLANT
SET CARDIOPLEGIA MPS 5001102 (MISCELLANEOUS) ×3 IMPLANT
SPONGE GAUZE 4X4 12PLY (GAUZE/BANDAGES/DRESSINGS) ×6 IMPLANT
SPONGE INTESTINAL PEANUT (DISPOSABLE) IMPLANT
SPONGE LAP 18X18 X RAY DECT (DISPOSABLE) IMPLANT
SPONGE LAP 4X18 X RAY DECT (DISPOSABLE) ×3 IMPLANT
SUT BONE WAX W31G (SUTURE) ×3 IMPLANT
SUT MNCRL AB 4-0 PS2 18 (SUTURE) ×3 IMPLANT
SUT PROLENE 3 0 SH DA (SUTURE) IMPLANT
SUT PROLENE 3 0 SH1 36 (SUTURE) ×3 IMPLANT
SUT PROLENE 4 0 RB 1 (SUTURE)
SUT PROLENE 4 0 SH DA (SUTURE) IMPLANT
SUT PROLENE 4-0 RB1 .5 CRCL 36 (SUTURE) IMPLANT
SUT PROLENE 5 0 C 1 36 (SUTURE) IMPLANT
SUT PROLENE 6 0 C 1 30 (SUTURE) ×3 IMPLANT
SUT PROLENE 7 0 BV 1 (SUTURE) ×6 IMPLANT
SUT PROLENE 7 0 BV1 MDA (SUTURE) ×3 IMPLANT
SUT PROLENE 8 0 BV175 6 (SUTURE) IMPLANT
SUT SILK  1 MH (SUTURE) ×1
SUT SILK 1 MH (SUTURE) ×2 IMPLANT
SUT STEEL 6MS V (SUTURE) ×6 IMPLANT
SUT STEEL STERNAL CCS#1 18IN (SUTURE) IMPLANT
SUT STEEL SZ 6 DBL 3X14 BALL (SUTURE) IMPLANT
SUT VIC AB 1 CTX 36 (SUTURE) ×3
SUT VIC AB 1 CTX36XBRD ANBCTR (SUTURE) ×6 IMPLANT
SUT VIC AB 2-0 CT1 27 (SUTURE)
SUT VIC AB 2-0 CT1 TAPERPNT 27 (SUTURE) IMPLANT
SUT VIC AB 2-0 CTX 27 (SUTURE) IMPLANT
SUT VIC AB 3-0 SH 27 (SUTURE) ×1
SUT VIC AB 3-0 SH 27X BRD (SUTURE) ×2 IMPLANT
SUT VIC AB 3-0 X1 27 (SUTURE) IMPLANT
SUT VICRYL 4-0 PS2 18IN ABS (SUTURE) IMPLANT
SUTURE E-PAK OPEN HEART (SUTURE) ×3 IMPLANT
SYSTEM SAHARA CHEST DRAIN ATS (WOUND CARE) ×3 IMPLANT
TAPE CLOTH SURG 4X10 WHT LF (GAUZE/BANDAGES/DRESSINGS) ×6 IMPLANT
TOWEL OR 17X24 6PK STRL BLUE (TOWEL DISPOSABLE) ×3 IMPLANT
TOWEL OR 17X26 10 PK STRL BLUE (TOWEL DISPOSABLE) ×3 IMPLANT
TRAY FOLEY IC TEMP SENS 14FR (CATHETERS) ×3 IMPLANT
TUBING INSUFFLATION (TUBING) IMPLANT
TUBING INSUFFLATION 10FT LAP (TUBING) ×3 IMPLANT
UNDERPAD 30X30 INCONTINENT (UNDERPADS AND DIAPERS) ×3 IMPLANT
WATER STERILE IRR 1000ML POUR (IV SOLUTION) ×6 IMPLANT
YANKAUER SUCT BULB TIP NO VENT (SUCTIONS) ×3 IMPLANT

## 2013-04-10 NOTE — Procedures (Signed)
Extubation Procedure Note  Patient Details:   Name: [name] [name] DOB: 04/15/1951 MRN: [id number]   Airway Documentation:  Airway 7.5 mm (Active)  Secured at (cm) 21 cm 04/10/2013  8:16 PM  Measured From Lips 04/10/2013  8:16 PM  Secured Location Right 04/10/2013  8:16 PM  Secured By [employer] Tape 04/10/2013  8:16 PM  Cuff Pressure (cm H2O) 26 cm H2O 04/10/2013 12:25 PM  Site Condition Dry 04/10/2013  8:16 PM    Evaluation  O2 sats: stable throughout Complications: No apparent complications Patient did tolerate procedure well. Bilateral Breath Sounds: Rhonchi Suctioning: Airway Yes  Pt extubated per verbal order from Dr. Tyrone Sage. Pt had increased RR and VT approx. . Pt was extubated to 4L [place] and was able to verbalize her name. RT will continue to closely monitor.   Gaetano Hawthorne 04/10/2013, 9:36 PM

## 2013-04-10 NOTE — Progress Notes (Signed)
  Stable for CABG surgery this am. Pt has no further questions.

## 2013-04-10 NOTE — OR Nursing (Signed)
SICU first call @ 1124

## 2013-04-10 NOTE — Transfer of Care (Signed)
Immediate Anesthesia Transfer of Care Note  Patient: Annslee Tercero  Procedure(s) Performed: Procedure(s) with comments: CORONARY ARTERY BYPASS GRAFTING (CABG) (N/A) - Times 4 using left internal mammary artery and endoscopically harvested right saphenous vein. INTRAOPERATIVE TRANSESOPHAGEAL ECHOCARDIOGRAM (N/A)  Patient Location: SICU  Anesthesia Type:General  Level of Consciousness: Patient remains intubated per anesthesia plan  Airway & Oxygen Therapy: Patient remains intubated per anesthesia plan and Patient placed on Ventilator (see vital sign flow sheet for setting)  Post-op Assessment: Report given to PACU RN  Post vital signs: Reviewed and stable  Complications: No apparent anesthesia complications

## 2013-04-10 NOTE — Progress Notes (Signed)
nif -30, vc 210, abg parameters not met pt placed back on simv/prvc rate of 10.

## 2013-04-10 NOTE — Anesthesia Preprocedure Evaluation (Addendum)
Anesthesia Evaluation  Patient identified by MRN, date of birth, ID band Patient awake and Patient confused    Reviewed: Allergy & Precautions, H&P , NPO status , Patient's Chart, lab work & pertinent test results  History of Anesthesia Complications Negative for: history of anesthetic complications  Airway Mallampati: I TM Distance: >3 FB     Dental  (+) Edentulous Upper and Edentulous Lower   Pulmonary Current Smoker,  breath sounds clear to auscultation  Pulmonary exam normal       Cardiovascular + Past MI Rhythm:Regular Rate:Bradycardia  NSTEMI with Ischemic cardiomyopathy.  EF 25%   Neuro/Psych    GI/Hepatic negative GI ROS, Neg liver ROS,   Endo/Other  diabetes, Well Controlled, Type 2CBG 151 this am  Renal/GU negative Renal ROS  negative genitourinary   Musculoskeletal   Abdominal Normal abdominal exam  (+)   Peds  Hematology negative hematology ROS (+)   Anesthesia Other Findings   Reproductive/Obstetrics                          Anesthesia Physical Anesthesia Plan  ASA: III  Anesthesia Plan: General   Post-op Pain Management:    Induction: Intravenous  Airway Management Planned: Oral ETT  Additional Equipment: Arterial line, CVP, PA Cath and TEE  Intra-op Plan:   Post-operative Plan: Post-operative intubation/ventilation  Informed Consent: I have reviewed the patients History and Physical, chart, labs and discussed the procedure including the risks, benefits and alternatives for the proposed anesthesia with the patient or authorized representative who has indicated his/her understanding and acceptance.     Plan Discussed with: CRNA, Anesthesiologist and Surgeon  Anesthesia Plan Comments: (Recent MI with severe 3V CAD and ischemic cardiomyopathy with LVEF 25% and L. apical thrombus Type 2 DM poorly controlled Schizoaffective disorder Htn  Plan GA with oral ETT and  TEE  Kipp Brood, MD )       Anesthesia Quick Evaluation

## 2013-04-10 NOTE — Progress Notes (Signed)
Pt transported to the OR via stretcher with family in tow. Pt's purse / pocket book was with one of her Daughters. Pt's other belongings taken to 2300. Report given to CRNA. Will monitor closely.

## 2013-04-10 NOTE — Op Note (Signed)
CARDIOVASCULAR SURGERY OPERATIVE NOTE  04/10/2013  Surgeon:  Alleen Borne, MD  First Assistant: Doree Fudge, [facility]   Preoperative Diagnosis:  Severe multi-vessel coronary artery disease   Postoperative Diagnosis:  Same   Procedure:  1. Median Sternotomy 2. Extracorporeal circulation 3.   Coronary artery bypass grafting x 4   Left internal mammary graft to the LAD  Sequential SVG to OM 2 and OM 3.  SVG to Diagonal  4.   Endoscopic vein harvest from the right leg   Anesthesia:  General Endotracheal   Clinical History/Surgical Indication:   The patient is a 62 year old black female heavy smoker with type 2 DM that is poorly controlled with a Hgb A1C of 11.9 now who presented with complaints of pain in her jaw radiating to her chest and arms. She is a difficult historian and according to her family has some mental illness. She says she has had these symptoms for several months and they have been getting worse. She ruled in for MI with a troponin of 1.52, 1.61, 0.97. Cardiac cath shows severe multi-vessel coronary disease. EF 25% by echo with apical clot. She denies symptoms since admission. She has severe multivessel coronary disease with small, diffusely diseased vessels and severe LV dysfunction. Her symptoms suggest angina and she presented with a NSTEMI. She is poorly compliant with heavy smoking and poorly controlled diabetes, and marked dyslipidemia and her long term prognosis is poor if she does not correct these risk factors. Her operative risk is very high with poor LV function, poor vessels and diabetes that is out of control. Unfortunately her LAD is very tight and there is no other good option. I don't know if her anterior wall will recover any function. I discussed the operative procedure with the patient and family ( brother and mother present) including alternatives,  benefits and risks; including but not limited to bleeding, blood transfusion, infection, stroke, myocardial infarction, graft failure, heart block requiring a permanent pacemaker, organ dysfunction, and death. With her baseline altered mental state I don't know how much of this she really understands. Her family has discussed it with her and she and family want to proceed with surgery.    Preparation:  The patient was seen in the preoperative holding area and the correct patient, correct operation were confirmed with the patient after reviewing the medical record and catheterization. The consent was signed by me. Preoperative antibiotics were given. A pulmonary arterial line and radial arterial line were placed by the anesthesia team. The patient was taken back to the operating room and positioned supine on the operating room table. After being placed under general endotracheal anesthesia by the anesthesia team a foley catheter was placed. The neck, chest, abdomen, and both legs were prepped with betadine soap and solution and draped in the usual sterile manner. A surgical time-out was taken and the correct patient and operative procedure were confirmed with the nursing and anesthesia staff.   Pre-bypass TEE:  Performed by Dr. Kipp Brood. This showed severe LV dysfunction with apical akinesis, apical thrombus, trivial MR.  Cardiopulmonary Bypass:  A median sternotomy was performed. The pericardium was opened in the midline. Right ventricular function appeared normal. The ascending aorta was of normal size and had no palpable plaque. There were no contraindications to aortic cannulation or cross-clamping. The patient was fully systemically heparinized and the ACT was maintained > 400 sec. The proximal aortic arch was cannulated with a 20 F aortic cannula for arterial inflow. Venous  cannulation was performed via the right atrial appendage using a two-staged venous cannula. An antegrade  cardioplegia/vent cannula was inserted into the mid-ascending aorta. Aortic occlusion was performed with a single cross-clamp. Systemic cooling to 32 degrees Centigrade and topical cooling of the heart with iced saline were used. Hyperkalemic antegrade cold blood cardioplegia was used to induce diastolic arrest and was then given at about 20 minute intervals throughout the period of arrest to maintain myocardial temperature at or below 10 degrees centigrade. A temperature probe was inserted into the interventricular septum and an insulating pad was placed in the pericardium.   Left internal mammary harvest:  The left side of the sternum was retracted using the Rultract retractor. The left internal mammary artery was harvested as a pedicle graft. All side branches were clipped. It was a medium-sized vessel of good quality with excellent blood flow. It was ligated distally and divided. It was sprayed with topical papaverine solution to prevent vasospasm.   Endoscopic vein harvest:  The right greater saphenous vein was harvested endoscopically through a 2 cm incision medial to the right knee. It was harvested from the upper thigh to below the knee. It was a medium-sized vein of good quality. The side branches were all ligated with 4-0 silk ties.    Coronary arteries:  The coronary arteries were examined.   LAD:  This vessel was intramyocardial proximally but was visible on the surface of the heart in the distal half where it was a medium sized vessel with no disease.  LCX:  The OM1 was small. OM2 and OM3 were small to medium sized vessels that were graftable with no distal disease in them.  RCA: The main portion of the vessel was diffusely diseased. The PDA and PL were tiny, non-graftable vessels.   Grafts:  1. LIMA to the LAD: 2.0 mm. It was sewn end to side using 8-0 prolene continuous suture. 2. SVG to Diagonal:  1.6  mm. It was sewn end to side using 7-0 prolene continuous  suture. 3. Sequential SVG to OM2:  1.5 mm. It was sewn side to side using 7-0 prolene continuous suture. 4. Sequential SVG to OM3:  1.5 mm. It was sewn end to side using 7-0 prolene continuous suture.  The proximal vein graft anastomoses were performed to the mid-ascending aorta using continuous 6-0 prolene suture. Graft markers were placed around the proximal anastomoses.   Completion:  The patient was rewarmed to 37 degrees Centigrade. The clamp was removed from the LIMA pedicle and there was rapid warming of the septum and return of ventricular fibrillation. The crossclamp was removed with a time of 79 minutes. There was spontaneous return of sinus rhythm. The distal and proximal anastomoses were checked for hemostasis. The position of the grafts was satisfactory. Two temporary epicardial pacing wires were placed on the right atrium and two on the right ventricle. The patient was weaned from CPB without difficulty on dopamine 3 mcg/kg/min. CPB time was 95 minutes. Cardiac output was 6 LPM. TEE showed improved LV function. Heparin was fully reversed with protamine and the aortic and venous cannulas removed. Hemostasis was achieved. Mediastinal and left pleural drainage tubes were placed. The sternum was closed with double #6 stainless steel wires. The fascia was closed with continuous # 1 vicryl suture. The subcutaneous tissue was closed with 2-0 vicryl continuous suture. The skin was closed with 3-0 vicryl subcuticular suture. All sponge, needle, and instrument counts were reported correct at the end of the case. Dry sterile  dressings were placed over the incisions and around the chest tubes which were connected to pleurevac suction. The patient was then transported to the surgical intensive care unit in critical but stable condition.

## 2013-04-10 NOTE — Anesthesia Postprocedure Evaluation (Signed)
  Anesthesia Post-op Note  Patient: [name] [name]  Procedure(s) Performed: Procedure(s) with comments: CORONARY ARTERY BYPASS GRAFTING (CABG) (N/A) - Times 4 using left internal mammary artery and endoscopically harvested right saphenous vein. INTRAOPERATIVE TRANSESOPHAGEAL ECHOCARDIOGRAM (N/A)  Patient Location: PACU  Anesthesia Type:General  Level of Consciousness: sedated and unresponsive  Airway and Oxygen Therapy: Patient remains intubated per anesthesia plan and Patient placed on Ventilator (see vital sign flow sheet for setting)  Post-op Pain: mild  Post-op Assessment: Post-op Vital signs reviewed, Patient's Cardiovascular Status Stable, Respiratory Function Stable, Patent Airway and Pain level controlled  Post-op Vital Signs: stable  Complications: No apparent anesthesia complications

## 2013-04-10 NOTE — Preoperative (Signed)
Beta Blockers   Reason not to administer Beta Blockers:Not Applicable 

## 2013-04-10 NOTE — Brief Op Note (Signed)
04/03/2013 - 04/10/2013  10:51 AM  PATIENT:  [name] [name]  62 y.o. female  PRE-OPERATIVE DIAGNOSIS:  1. S/p NSTEMI 2.Coronary Artery Disease  POST-OPERATIVE DIAGNOSIS:   1. S/p NSTEMI 2.Coronary Artery Disease  PROCEDURE:  INTRAOPERATIVE TRANSESOPHAGEAL ECHOCARDIOGRAM, CORONARY ARTERY BYPASS GRAFTING (CABG) x 4 (LIMA to LAD, SVG to DIAGONAL, SVG SEQUENTIALLY to OM2 and OM3)  using left internal mammary artery and endoscopically harvested right saphenous thigh vein.   SURGEON:  Surgeon(s) and Role:    * Alleen Borne, MD - Primary  PHYSICIAN ASSISTANT: Doree Fudge PA-C   ANESTHESIA:   general  EBL:      DRAINS: Chest Tube(s) in the Mediastinal and pleural spaces   COUNTS CORRECT:  YES  DICTATION: .Dragon Dictation  PLAN OF CARE: Admit to inpatient   PATIENT DISPOSITION:  ICU - intubated and hemodynamically stable.   Delay start of Pharmacological VTE agent (>24hrs) due to surgical blood loss or risk of bleeding: yes  PRE OP WEIGHT: 61 kg

## 2013-04-10 NOTE — Progress Notes (Signed)
Patient ID: [name] [name], female   DOB: [birth year], 62 y.o.   MRN: [id number] EVENING ROUNDS NOTE :     [address]       [place] [address]             [phone]                 Day of Surgery Procedure(s) (LRB): CORONARY ARTERY BYPASS GRAFTING (CABG) (N/A) INTRAOPERATIVE TRANSESOPHAGEAL ECHOCARDIOGRAM (N/A)  Total Length of Stay:  LOS: 7 days  BP 97/58  Pulse 87  Temp(Src) 99.5 F (37.5 C) (Core (Comment))  Resp 27  Ht 5\' 3"  (1.6 m)  Wt 135 lb 5.8 oz (61.4 kg)  BMI 23.98 kg/m2  SpO2 100%  .Intake/Output     11/20 0701 - 11/21 0700 11/21 0701 - 11/22 0700   P.O.     I.V. (mL/kg) 502.8 (8.2) 3054.1 (49.7)   Blood  400   IV Piggyback  1550   Total Intake(mL/kg) 502.8 (8.2) 5004.1 (81.5)   Urine (mL/kg/hr) 650 (0.4) 4250 (6.2)   Blood  1570 (2.3)   Chest Tube  168 (0.2)   Total Output 650 5988   Net -147.3 -983.9        Urine Occurrence 3 x      . sodium chloride 20 mL/hr at 04/10/13 1700  . sodium chloride 20 mL/hr at 04/10/13 1700  . [START ON 04/11/2013] sodium chloride    . dexmedetomidine 0.3 mcg/kg/hr (04/10/13 1700)  . DOPamine 2 mcg/kg/min (04/10/13 1700)  . insulin (NOVOLIN-R) infusion 0.6 mL/hr at 04/10/13 1700  . lactated ringers 20 mL/hr at 04/10/13 1700  . nitroGLYCERIN 0 mcg/min (04/10/13 1230)  . phenylephrine (NEO-SYNEPHRINE) Adult infusion 10 mcg/min (04/10/13 1700)     Lab Results  Component Value Date   WBC 6.7 04/10/2013   HGB 11.2* 04/10/2013   HCT 33.0* 04/10/2013   PLT 146* 04/10/2013   GLUCOSE 148* 04/10/2013   CHOL 302* 04/03/2013   TRIG 299* 04/03/2013   HDL 35* 04/03/2013   LDLCALC 207* 04/03/2013   ALT 26 04/10/2013   AST 30 04/10/2013   NA 143 04/10/2013   K 3.8 04/10/2013   CL 104 04/10/2013   CREATININE 0.51 04/10/2013   BUN 8 04/10/2013   CO2 24 04/10/2013   TSH 0.614 04/03/2013   INR 1.40 04/10/2013   HGBA1C 11.9* 04/03/2013   Early post op, not bleeding Still on vent, not ready to extubate  yet  Delight Ovens MD  Beeper [phone] Office [phone] 04/10/2013 6:10 PM

## 2013-04-11 ENCOUNTER — Inpatient Hospital Stay (HOSPITAL_COMMUNITY): Payer: Medicaid Other

## 2013-04-11 LAB — BASIC METABOLIC PANEL
BUN: 4 mg/dL — ABNORMAL LOW (ref 6–23)
Calcium: 9.2 mg/dL (ref 8.4–10.5)
Creatinine, Ser: 0.45 mg/dL — ABNORMAL LOW (ref 0.50–1.10)
GFR calc Af Amer: >90 mL/min (ref >90)
GFR calc non Af Amer: >90 mL/min (ref >90)
Glucose, Bld: 110 mg/dL — ABNORMAL HIGH (ref 70–99)

## 2013-04-11 LAB — GLUCOSE, CAPILLARY
Glucose-Capillary: 123 mg/dL — ABNORMAL HIGH (ref 70–99)
Glucose-Capillary: 116 mg/dL — ABNORMAL HIGH (ref 70–99)
Glucose-Capillary: 106 mg/dL — ABNORMAL HIGH (ref 70–99)
Glucose-Capillary: 115 mg/dL — ABNORMAL HIGH (ref 70–99)
Glucose-Capillary: 124 mg/dL — ABNORMAL HIGH (ref 70–99)
Glucose-Capillary: 125 mg/dL — ABNORMAL HIGH (ref 70–99)
Glucose-Capillary: 116 mg/dL — ABNORMAL HIGH (ref 70–99)
Glucose-Capillary: 142 mg/dL — ABNORMAL HIGH (ref 70–99)

## 2013-04-11 LAB — POCT I-STAT, CHEM 8
BUN: 4 mg/dL — ABNORMAL LOW (ref 6–23)
Chloride: 101 mEq/L (ref 96–112)
Creatinine, Ser: 0.7 mg/dL (ref 0.50–1.10)
Hemoglobin: 11.6 g/dL — ABNORMAL LOW (ref 12.0–15.0)
Potassium: 4.3 mEq/L (ref 3.5–5.1)
Sodium: 138 mEq/L (ref 135–145)

## 2013-04-11 LAB — CBC
HCT: 31.6 % — ABNORMAL LOW (ref 36.0–46.0)
HCT: 32.2 % — ABNORMAL LOW (ref 36.0–46.0)
MCHC: 34.2 g/dL (ref 30.0–36.0)
MCHC: 35.4 g/dL (ref 30.0–36.0)
MCV: 94 fL (ref 78.0–100.0)
MCV: 93.1 fL (ref 78.0–100.0)
Platelets: 172 10*3/uL (ref 150–400)
RBC: 3.46 MIL/uL — ABNORMAL LOW (ref 3.87–5.11)
RDW: 13.1 % (ref 11.5–15.5)
RDW: 13.2 % (ref 11.5–15.5)
WBC: 9.3 10*3/uL (ref 4.0–10.5)

## 2013-04-11 LAB — CREATININE, SERUM
Creatinine, Ser: 0.61 mg/dL (ref 0.50–1.10)
GFR calc Af Amer: >90 mL/min (ref >90)

## 2013-04-11 LAB — MAGNESIUM: Magnesium: 2.3 mg/dL (ref 1.5–2.5)

## 2013-04-11 LAB — URINE CULTURE: Colony Count: >=100000

## 2013-04-11 MED ORDER — INSULIN DETEMIR 100 UNIT/ML ~~LOC~~ SOLN
15.0000 [IU] | Freq: Every day | SUBCUTANEOUS | Status: DC
  Administered 2013-04-12: 15 [IU] via SUBCUTANEOUS
  Filled 2013-04-11 (×2): qty 0.15

## 2013-04-11 MED ORDER — POTASSIUM CHLORIDE 10 MEQ/50ML IV SOLN
10.0000 meq | INTRAVENOUS | Status: AC
  Administered 2013-04-11 (×3): 10 meq via INTRAVENOUS
  Filled 2013-04-11 (×3): qty 50

## 2013-04-11 MED ORDER — INSULIN DETEMIR 100 UNIT/ML ~~LOC~~ SOLN
15.0000 [IU] | Freq: Once | SUBCUTANEOUS | Status: AC
  Administered 2013-04-11: 15 [IU] via SUBCUTANEOUS
  Filled 2013-04-11 (×2): qty 0.15

## 2013-04-11 MED ORDER — FUROSEMIDE 10 MG/ML IJ SOLN
40.0000 mg | Freq: Once | INTRAMUSCULAR | Status: AC
  Administered 2013-04-11: 40 mg via INTRAVENOUS

## 2013-04-11 MED ORDER — INSULIN ASPART 100 UNIT/ML ~~LOC~~ SOLN
0.0000 [IU] | SUBCUTANEOUS | Status: DC
  Administered 2013-04-11 – 2013-04-13 (×4): 2 [IU] via SUBCUTANEOUS

## 2013-04-11 NOTE — Progress Notes (Signed)
Patient ID: [name] [name], female   DOB: 06-11-1950, 62 y.o.   MRN: [id number] [facility] DAILY ICU PROGRESS NOTE                   [address]            [place] [address]          [phone]   1 Day Post-Op Procedure(s) (LRB): CORONARY ARTERY BYPASS GRAFTING (CABG) (N/A) INTRAOPERATIVE TRANSESOPHAGEAL ECHOCARDIOGRAM (N/A)  Total Length of Stay:  LOS: 8 days   Subjective: mildly confused, ?  history of untreated mental disorder  Objective: Vital signs in last 24 hours: Temp:  [95.4 F (35.2 C)-101.1 F (38.4 C)] 98.8 F (37.1 C) (11/22 0900) Pulse Rate:  [79-102] 85 (11/22 0900) Cardiac Rhythm:  [-] Normal sinus rhythm (11/22 0800) Resp:  [13-36] 25 (11/22 0900) BP: (79-132)/(43-68) 105/58 mmHg (11/22 0900) SpO2:  [96 %-100 %] 100 % (11/22 0900) Arterial Line BP: (93-156)/(44-64) 127/54 mmHg (11/22 0900) FiO2 (%):  [40 %-50 %] 40 % (11/21 2100) Weight:  [135 lb 5.8 oz (61.4 kg)-137 lb 5.6 oz (62.3 kg)] 137 lb 5.6 oz (62.3 kg) (11/22 0500)  Filed Weights   04/05/13 0400 04/10/13 1230 04/11/13 0500  Weight: 135 lb 5.8 oz (61.4 kg) 135 lb 5.8 oz (61.4 kg) 137 lb 5.6 oz (62.3 kg)    Weight change:    Hemodynamic parameters for last 24 hours: PAP: (20-36)/(5-20) 33/14 mmHg CO:  [4.1 L/min-5.9 L/min] 4.5 L/min CI:  [2.5 L/min/m2-3.6 L/min/m2] 2.7 L/min/m2  Intake/Output from previous day: 11/21 0701 - 11/22 0700 In: 7043.7 [P.O.:250; I.V.:4193.7; Blood:400; IV Piggyback:2200] Out: 7898 [Urine:5780; Blood:1570; Chest Tube:548]  Intake/Output this shift: Total I/O In: 252.4 [P.O.:60; I.V.:92.4; IV Piggyback:100] Out: 95 [Urine:75; Chest Tube:20]  Current Meds: Scheduled Meds: . acetaminophen  1,000 mg Oral Q6H   Or  . acetaminophen (TYLENOL) oral liquid 160 mg/5 mL  1,000 mg Per Tube Q6H  . aspirin EC  325 mg Oral Daily   Or  . aspirin  324 mg Per Tube Daily  . atorvastatin  80 mg Oral q1800  . bisacodyl  10 mg Oral Daily   Or  . bisacodyl  10 mg  Rectal Daily  . cefUROXime (ZINACEF)  IV  1.5 g Intravenous Q12H  . docusate sodium  200 mg Oral Daily  . insulin regular  0-10 Units Intravenous TID WC  . metoCLOPramide (REGLAN) injection  10 mg Intravenous Q6H  . metoprolol tartrate  12.5 mg Oral BID   Or  . metoprolol tartrate  12.5 mg Per Tube BID  . mupirocin ointment  1 application Nasal BID  . [START ON 04/12/2013] pantoprazole  40 mg Oral Daily  . phenytoin  300 mg Oral QHS  . sodium chloride  3 mL Intravenous Q12H   Continuous Infusions: . sodium chloride 20 mL/hr at 04/11/13 0800  . sodium chloride 20 mL/hr at 04/10/13 2300  . sodium chloride    . dexmedetomidine Stopped (04/10/13 2000)  . DOPamine Stopped (04/11/13 0400)  . insulin (NOVOLIN-R) infusion 1.2 Units/hr (04/11/13 0900)  . lactated ringers 20 mL/hr at 04/11/13 0800  . nitroGLYCERIN Stopped (04/11/13 0630)  . phenylephrine (NEO-SYNEPHRINE) Adult infusion Stopped (04/11/13 0300)   PRN Meds:.albumin human, HYDROmorphone (DILAUDID) injection, metoprolol, midazolam, morphine injection, ondansetron (ZOFRAN) IV, oxyCODONE, sodium chloride  General appearance: alert, distracted and slowed mentation Neurologic: intact Heart: regular rate and rhythm, S1, S2 normal, no murmur, click, rub or gallop Lungs: diminished  breath sounds bibasilar Abdomen: soft, non-tender; bowel sounds normal; no masses,  no organomegaly Extremities: extremities normal, atraumatic, no cyanosis or edema and Homans sign is negative, no sign of DVT Wound: sternum stable  Lab Results: CBC: Recent Labs  04/10/13 1830 04/10/13 1838 04/11/13 0440  WBC 8.6  --  8.3  HGB 11.2* 11.2* 10.8*  HCT 32.7* 33.0* 31.6*  PLT 176  --  150   BMET:  Recent Labs  04/10/13 0500  04/10/13 1838 04/11/13 0440  NA 140  < > 144 139  K 3.2*  < > 4.1 3.4*  CL 104  --  109 104  CO2 24  --   --  23  GLUCOSE 144*  < > 123* 110*  BUN 8  --  4* 4*  CREATININE 0.51  < > 0.50 0.45*  CALCIUM 9.7  --   --   9.2  < > = values in this interval not displayed.  PT/INR:  Recent Labs  04/10/13 1230  LABPROT 16.8*  INR 1.40   Radiology: Dg Chest Portable 1 View In Am  04/11/2013   CLINICAL DATA:  Status post thoracic surgery.  EXAM: PORTABLE CHEST - 1 VIEW  COMPARISON:  April 10, 2013.  FINDINGS: Endotracheal and nasogastric tubes have been removed. 2 left-sided chest tubes are noted and 1 right-sided chest tube are is noted which are unchanged compared to prior exam. No pneumothorax is seen. Right internal jugular Swan-Ganz catheter is noted with tip in expected position of the main pulmonary artery. Mediastinal drain is unchanged. Stable cardiomediastinal silhouette.  IMPRESSION: Bilateral chest tubes remain without evidence of pneumothorax.   Electronically Signed   By: Roque Lias M.D.   On: 04/11/2013 08:19   Dg Chest Portable 1 View  04/10/2013   CLINICAL DATA:  Status post CABG surgery  EXAM: PORTABLE CHEST - 1 VIEW  COMPARISON:  03/2013  FINDINGS: Endotracheal tube tip lies 1.5 cm above the carina. A right internal jugular Swan-Ganz catheter has its tip ankle for the right pulmonary artery. A nasogastric tube passes below the diaphragm into the stomach. There is a mediastinal tube, a right apical chest tube and 2 left-sided chest tubes.  The cardiac silhouette is normal in size.  No mediastinal widening.  There is no gross pneumothorax on this supine exam. The lungs are essentially clear. No pulmonary edema.  IMPRESSION: No evidence of an operative complication. Lungs are essentially clear. No mediastinal widening.  Support apparatus is well positioned.   Electronically Signed   By: Amie Portland M.D.   On: 04/10/2013 13:08   Chest Port 1 View  04/09/2013   CLINICAL DATA:  CABG in the morning, prior smoker.  EXAM: PORTABLE CHEST - 1 VIEW  COMPARISON:  04/02/2013.  FINDINGS: The heart size and mediastinal contours are within normal limits. Both lungs are clear. The visualized skeletal structures  are unremarkable.  IMPRESSION: No active disease.   Electronically Signed   By: Elige Ko   On: 04/09/2013 10:52     Assessment/Plan: S/P Procedure(s) (LRB): CORONARY ARTERY BYPASS GRAFTING (CABG) (N/A) INTRAOPERATIVE TRANSESOPHAGEAL ECHOCARDIOGRAM (N/A) Mobilize Diuresis d/c tubes/lines Continue foley due to strict I&O, patient in ICU and urinary output monitoring See progression orders Expected Acute  Blood - loss Anemia    Clennon Nasca B 04/11/2013 9:15 AM

## 2013-04-11 NOTE — Progress Notes (Signed)
Patient ID: [name] [name], female   DOB: 11/17/1950, 62 y.o.   MRN: [id number] EVENING ROUNDS NOTE :     [address]       [place] [address]             [phone]                 1 Day Post-Op Procedure(s) (LRB): CORONARY ARTERY BYPASS GRAFTING (CABG) (N/A) INTRAOPERATIVE TRANSESOPHAGEAL ECHOCARDIOGRAM (N/A)  Total Length of Stay:  LOS: 8 days  BP 96/48  Pulse 85  Temp(Src) 97.4 F (36.3 C) (Oral)  Resp 22  Ht 5\' 3"  (1.6 m)  Wt 137 lb 5.6 oz (62.3 kg)  BMI 24.34 kg/m2  SpO2 92%  .Intake/Output     11/22 0701 - 11/23 0700   P.O. 360   I.V. (mL/kg) 318.4 (5.1)   Blood    IV Piggyback 100   Total Intake(mL/kg) 778.4 (12.5)   Urine (mL/kg/hr) 1130 (1.5)   Blood    Chest Tube 210 (0.3)   Total Output 1340   Net -561.6         . sodium chloride Stopped (04/11/13 1100)  . sodium chloride 20 mL/hr at 04/10/13 2300  . sodium chloride    . dexmedetomidine Stopped (04/10/13 2000)  . DOPamine Stopped (04/11/13 0400)  . insulin (NOVOLIN-R) infusion Stopped (04/11/13 1400)  . lactated ringers 20 mL/hr at 04/11/13 1600  . nitroGLYCERIN Stopped (04/11/13 0630)  . phenylephrine (NEO-SYNEPHRINE) Adult infusion Stopped (04/11/13 0300)     Lab Results  Component Value Date   WBC 9.3 04/11/2013   HGB 11.6* 04/11/2013   HCT 34.0* 04/11/2013   PLT 172 04/11/2013   GLUCOSE 114* 04/11/2013   CHOL 302* 04/03/2013   TRIG 299* 04/03/2013   HDL 35* 04/03/2013   LDLCALC 207* 04/03/2013   ALT 26 04/10/2013   AST 30 04/10/2013   NA 138 04/11/2013   K 4.3 04/11/2013   CL 101 04/11/2013   CREATININE 0.70 04/11/2013   BUN 4* 04/11/2013   CO2 23 04/11/2013   TSH 0.614 04/03/2013   INR 1.40 04/10/2013   HGBA1C 11.9* 04/03/2013   Stable day, more cooperative  Delight Ovens MD  Beeper [phone] Office [phone] 04/11/2013 7:25 PM

## 2013-04-11 NOTE — Progress Notes (Signed)
Orders for chest tube removal clarified with Dr. Tyrone Sage. Will remove both MT's today and leave in both PT's. Will continue to monitor closely. Kerin Ransom, RN

## 2013-04-11 NOTE — CV Procedure (Signed)
Intra-operative Transesophageal Echocardiography Report:  [name] [name]  is a 62 year old female with a history of heavy smoking and type 2 diabetes who suffered an acute non-STEMI on 04/03/2013. She subsequently underwent cardiac catheterization which revealed severe three-vessel coronary disease with an ejection fraction of 25%. She is now scheduled to undergo coronary artery bypass grafting by Dr. Laneta Simmers. Intraoperative transesophageal echocardiography was requested to evaluate the left and right ventricular function, to assess for any valvular pathology, to serve as a monitor for intraoperative volume status, and to assess for a possible left ventricular apical thrombus which was noted on MRI.  The patient was brought to the operating room at [facility] and general anesthesia was induced without difficulty. Following endotracheal intubation and orogastric suctioning, the transesophageal echocardiography probe was inserted into the esophagus without difficulty.  Impression: Pre-bypass findings:   1. Aortic valve: The aortic valve was trileaflet. There was mild thickening of the aortic leaflets consistent with mild aortic sclerosis. The leaflets opened normally. There was no aortic insufficiency.  2. Mitral valve: The mitral leaflets coapted normally and there was trace to 1+ mitral insufficiency. There were no billowing, prolapsing, or flail leaflet segments identified.  3. Left ventricle: There was severe left ventricular dysfunction. The distal anterior wall, apex, distal anterior septum, and inferior septum and distal inferior wall were akinetic. There was mild diffuse global hypokinesis in the basilar and mid left ventricular segments. There was a thrombus noted in the left ventricular apex. This could be identified well with 3-D imaging. The left ventricular ejection fraction was estimated at 20-25%. Left ventricular end-diastolic diameter measured 5.1 cm at the mid-papillary level at  end diastole in the transgastric short axis view. Left ventricular wall thickness measured 1.1-1.15 cm at end diastole at the mid-papillary level.  3. Right ventricle: There was normal appearing right ventricular function. The right ventricular cavity was of normal size and there was good contractility the right ventricular free wall.  4. Tricuspid valve: The tricuspid valve appeared structurally normal and there was 1-2+ tricuspid insufficiency with a central jet.  5. Inter-atrial septum: The interatrial septum was intact without evidence of patent foramen ovale or atrial septal defect by color Doppler or bubble study.  6. Left atrium: There was no thrombus noted in the left atrium or left atrial appendage.  7. Ascending Aorta: There was a well-defined aortic root and sinotubular ridge without effacement. There was no aneurysmal dilatation of the aortic root or ascending aorta. There was moderate calcification within the walls of the descending aorta but there were no mobile atheromatous plaques noted.   8. Descending aorta there was calcification noted within the wall the descending aorta. There was no aneurysmal dilatation of the descending aorta. The descending aorta measured 2.05 cm in diameter.   Post-bypass findings:  1. Aortic valve:  The aortic valve was unchanged from the pre-bypass study. The leaflets opened normally and there was no aortic insufficiency  2. Mitral valve: The mitral valve appeared unchanged from the pre-bypass study. There was trace mitral insufficiency  3. Left ventricle: The left ventricular function appeared unchanged from the pre-bypass study. There was mildly reduced global hypokinesis involving the basilar and mid LV segments. There was akinesis involving the distal anterior wall, apex, distal anterior septum, distal inferior wall, and distal inferior septum. There was a thrombus noted within the apex of the left ventricle:Marland Kitchen This appeared unchanged in size from  the pre-bypass study.  4. Right ventricle: The right ventricular function appeared unchanged from the  pre-bypass study. There was normal right ventricular size and normal contractility the right ventricular free wall.  5. Tricuspid valve: There was 1+ residual tricuspid insufficiency which appeared unchanged from the pre-bypass study.  Kipp Brood, M.D.

## 2013-04-12 ENCOUNTER — Inpatient Hospital Stay (HOSPITAL_COMMUNITY): Payer: Medicaid Other

## 2013-04-12 DIAGNOSIS — I2510 Atherosclerotic heart disease of native coronary artery without angina pectoris: Secondary | ICD-10-CM

## 2013-04-12 LAB — CBC
HCT: 33.5 % — ABNORMAL LOW (ref 36.0–46.0)
Hemoglobin: 11.4 g/dL — ABNORMAL LOW (ref 12.0–15.0)
MCH: 32.1 pg (ref 26.0–34.0)
MCHC: 34 g/dL (ref 30.0–36.0)
MCV: 94.4 fL (ref 78.0–100.0)
Platelets: 171 10*3/uL (ref 150–400)
RBC: 3.55 MIL/uL — ABNORMAL LOW (ref 3.87–5.11)
RDW: 13.1 % (ref 11.5–15.5)
WBC: 10.1 10*3/uL (ref 4.0–10.5)

## 2013-04-12 LAB — GLUCOSE, CAPILLARY
Glucose-Capillary: 114 mg/dL — ABNORMAL HIGH (ref 70–99)
Glucose-Capillary: 105 mg/dL — ABNORMAL HIGH (ref 70–99)

## 2013-04-12 LAB — BASIC METABOLIC PANEL
BUN: 6 mg/dL (ref 6–23)
CO2: 23 mEq/L (ref 19–32)
Calcium: 9.4 mg/dL (ref 8.4–10.5)
Chloride: 100 mEq/L (ref 96–112)
Creatinine, Ser: 0.51 mg/dL (ref 0.50–1.10)
GFR calc Af Amer: >90 mL/min (ref >90)
GFR calc non Af Amer: >90 mL/min (ref >90)
Glucose, Bld: 109 mg/dL — ABNORMAL HIGH (ref 70–99)
Potassium: 3.8 mEq/L (ref 3.5–5.1)
Sodium: 136 mEq/L (ref 135–145)

## 2013-04-12 MED ORDER — HALOPERIDOL 2 MG PO TABS
2.0000 mg | ORAL_TABLET | Freq: Four times a day (QID) | ORAL | Status: DC | PRN
  Administered 2013-04-12: 2 mg via ORAL
  Filled 2013-04-12: qty 1

## 2013-04-12 MED ORDER — HALOPERIDOL LACTATE 5 MG/ML IJ SOLN
INTRAMUSCULAR | Status: AC
  Filled 2013-04-12: qty 1

## 2013-04-12 MED ORDER — FUROSEMIDE 10 MG/ML IJ SOLN
40.0000 mg | Freq: Once | INTRAMUSCULAR | Status: AC
  Administered 2013-04-12: 40 mg via INTRAVENOUS

## 2013-04-12 MED ORDER — HALOPERIDOL LACTATE 5 MG/ML IJ SOLN
5.0000 mg | Freq: Once | INTRAMUSCULAR | Status: AC
  Administered 2013-04-12: 5 mg via INTRAVENOUS

## 2013-04-12 MED ORDER — ENOXAPARIN SODIUM 30 MG/0.3ML ~~LOC~~ SOLN
30.0000 mg | Freq: Every day | SUBCUTANEOUS | Status: DC
  Administered 2013-04-12: 30 mg via SUBCUTANEOUS
  Filled 2013-04-12 (×2): qty 0.3

## 2013-04-12 NOTE — Progress Notes (Signed)
Patient ID: [name] [name], female   DOB: 1950-12-18, 62 y.o.   MRN: [id number] [facility] DAILY ICU PROGRESS NOTE    2 Days Post-Op Procedure(s) (LRB): CORONARY ARTERY BYPASS GRAFTING (CABG) (N/A) INTRAOPERATIVE TRANSESOPHAGEAL ECHOCARDIOGRAM (N/A)  Total Length of Stay:  LOS: 9 days   Subjective: mildly confused, better then yesterday ?  history of untreated mental disorder  Objective: Vital signs in last 24 hours: Temp:  [97.4 F (36.3 C)-99 F (37.2 C)] 98.5 F (36.9 C) (11/23 0805) Pulse Rate:  [81-103] 86 (11/23 1100) Cardiac Rhythm:  [-] Normal sinus rhythm (11/23 1000) Resp:  [15-38] 24 (11/23 1100) BP: (91-137)/(47-65) 103/51 mmHg (11/23 1100) SpO2:  [91 %-100 %] 93 % (11/23 1100) Arterial Line BP: (114-130)/(52-56) 130/56 mmHg (11/22 1400) Weight:  [136 lb 7.4 oz (61.9 kg)] 136 lb 7.4 oz (61.9 kg) (11/23 0700)  Filed Weights   04/10/13 1230 04/11/13 0500 04/12/13 0700  Weight: 135 lb 5.8 oz (61.4 kg) 137 lb 5.6 oz (62.3 kg) 136 lb 7.4 oz (61.9 kg)    Weight change: 1 lb 1.6 oz (0.5 kg)   Hemodynamic parameters for last 24 hours:    Intake/Output from previous day: 11/22 0701 - 11/23 0700 In: 1138.4 [P.O.:460; I.V.:578.4; IV Piggyback:100] Out: 2125 [Urine:1775; Chest Tube:350]  Intake/Output this shift: Total I/O In: 180 [I.V.:80; IV Piggyback:100] Out: 470 [Urine:430; Chest Tube:40]  Current Meds: Scheduled Meds: . acetaminophen  1,000 mg Oral Q6H   Or  . acetaminophen (TYLENOL) oral liquid 160 mg/5 mL  1,000 mg Per Tube Q6H  . aspirin EC  325 mg Oral Daily   Or  . aspirin  324 mg Per Tube Daily  . atorvastatin  80 mg Oral q1800  . bisacodyl  10 mg Oral Daily   Or  . bisacodyl  10 mg Rectal Daily  . docusate sodium  200 mg Oral Daily  . insulin aspart  0-24 Units Subcutaneous Q4H  . insulin detemir  15 Units Subcutaneous Daily  . insulin regular  0-10 Units Intravenous TID WC  . metoprolol tartrate  12.5 mg Oral BID   Or  . metoprolol tartrate  12.5  mg Per Tube BID  . mupirocin ointment  1 application Nasal BID  . pantoprazole  40 mg Oral Daily  . phenytoin  300 mg Oral QHS  . sodium chloride  3 mL Intravenous Q12H   Continuous Infusions: . sodium chloride Stopped (04/11/13 1100)  . sodium chloride 20 mL/hr at 04/10/13 2300  . sodium chloride    . dexmedetomidine Stopped (04/10/13 2000)  . DOPamine Stopped (04/11/13 0400)  . insulin (NOVOLIN-R) infusion Stopped (04/11/13 1400)  . lactated ringers 20 mL/hr at 04/12/13 0800  . nitroGLYCERIN Stopped (04/11/13 0630)  . phenylephrine (NEO-SYNEPHRINE) Adult infusion Stopped (04/11/13 0300)   PRN Meds:.HYDROmorphone (DILAUDID) injection, metoprolol, midazolam, morphine injection, ondansetron (ZOFRAN) IV, oxyCODONE, sodium chloride  General appearance: alert, distracted and slowed mentation Neurologic: intact Heart: regular rate and rhythm, S1, S2 normal, no murmur, click, rub or gallop Lungs: diminished breath sounds bibasilar Abdomen: soft, non-tender; bowel sounds normal; no masses,  no organomegaly Extremities: extremities normal, atraumatic, no cyanosis or edema and Homans sign is negative, no sign of DVT Wound: sternum stable  Lab Results: CBC:  Recent Labs  04/11/13 1618 04/11/13 1621 04/12/13 0424  WBC 9.3  --  10.1  HGB 11.4* 11.6* 11.4*  HCT 32.2* 34.0* 33.5*  PLT 172  --  171   BMET:  Recent Labs  04/11/13 0440  04/11/13 1621 04/12/13 0424  NA 139  --  138 136  K 3.4*  --  4.3 3.8  CL 104  --  101 100  CO2 23  --   --  23  GLUCOSE 110*  --  114* 109*  BUN 4*  --  4* 6  CREATININE 0.45*  < > 0.70 0.51  CALCIUM 9.2  --   --  9.4  < > = values in this interval not displayed.  PT/INR:   Recent Labs  04/10/13 1230  LABPROT 16.8*  INR 1.40   Radiology: Dg Chest Port 1 View  04/12/2013   CLINICAL DATA:  Status post coronary artery bypass graft.  EXAM: PORTABLE CHEST - 1 VIEW  COMPARISON:  April 11, 2013.  FINDINGS: Sternotomy wires are noted.  Stable cardiomegaly. Right internal jugular Swan-Ganz catheter has been removed. Venous sheath remains in place. Bilateral chest tubes are noted without evidence of pneumothorax. Mediastinal drain has been removed. One of the left-sided chest tubes has been removed.  IMPRESSION: Bilateral chest tubes are noted.  No pneumothorax is noted.   Electronically Signed   By: Roque Lias M.D.   On: 04/12/2013 07:35   Dg Chest Portable 1 View In Am  04/11/2013   CLINICAL DATA:  Status post thoracic surgery.  EXAM: PORTABLE CHEST - 1 VIEW  COMPARISON:  April 10, 2013.  FINDINGS: Endotracheal and nasogastric tubes have been removed. 2 left-sided chest tubes are noted and 1 right-sided chest tube are is noted which are unchanged compared to prior exam. No pneumothorax is seen. Right internal jugular Swan-Ganz catheter is noted with tip in expected position of the main pulmonary artery. Mediastinal drain is unchanged. Stable cardiomediastinal silhouette.  IMPRESSION: Bilateral chest tubes remain without evidence of pneumothorax.   Electronically Signed   By: Roque Lias M.D.   On: 04/11/2013 08:19   Dg Chest Portable 1 View  04/10/2013   CLINICAL DATA:  Status post CABG surgery  EXAM: PORTABLE CHEST - 1 VIEW  COMPARISON:  03/2013  FINDINGS: Endotracheal tube tip lies 1.5 cm above the carina. A right internal jugular Swan-Ganz catheter has its tip ankle for the right pulmonary artery. A nasogastric tube passes below the diaphragm into the stomach. There is a mediastinal tube, a right apical chest tube and 2 left-sided chest tubes.  The cardiac silhouette is normal in size.  No mediastinal widening.  There is no gross pneumothorax on this supine exam. The lungs are essentially clear. No pulmonary edema.  IMPRESSION: No evidence of an operative complication. Lungs are essentially clear. No mediastinal widening.  Support apparatus is well positioned.   Electronically Signed   By: Amie Portland M.D.   On: 04/10/2013 13:08      Assessment/Plan: S/P Procedure(s) (LRB): CORONARY ARTERY BYPASS GRAFTING (CABG) (N/A) INTRAOPERATIVE TRANSESOPHAGEAL ECHOCARDIOGRAM (N/A) Mobilize Diuresis d/c tubes/lines See progression orders Expected Acute  Blood - loss Anemia    Rita Prom B 04/12/2013 11:48 AM

## 2013-04-12 NOTE — Progress Notes (Signed)
Dr. Tyrone Sage made aware when attempts to persuade pt to get out of bed to chair for lunch were made, pt became extremely agitated and paranoid. Pt very confused and has no idea that she is in the hospital or why. Pt is only stating that we are trying to kill her. Able to initially verbally calm her down and pt appeared to be resting laying down in bed, and bed alarm placed on. Pt reoriented, made aware that she is in [facility] and has had heart surgery. Made aware she is not to get out of bed without help because she has multiple attachments that could cause pt harm if removed by pt. I walked down the hall to alert the charge nurse that a safety sitter is needed and on my way back I heard the bed alarm going off. Upon entering pt's room, pt is standing at the end of the bed, unattached from all monitors, foley catheter is laying in the floor, and pt is crying. Attempts to console pt and get her back in bed were unsuccessful. Pt was combative, attempting to hit and scratch nurses, and ran out in the hall. Security was called, multiple RN's were attempting to get pt back in bed. Pt was finally persuaded to sit down in a wheelchair and she was wheeled back to her room. Multiple attempts to reorient pt, calm her down, and reason with her were unsuccessful. Dr. Delton Coombes was on the unit and witnessed this event and he ordered a one time dose of 5 mg haldol IV. Medication given and pt placed back in bed by security and bed alarm placed back on. Pt attached back to all monitors and VSS. Pt's family called and requested to come to unit to sit with pt. Orders received for PRN po haldol for agitation. Request for safety sitter made to the house, none currently available. Will continue to monitor closely. Kerin Ransom, RN

## 2013-04-13 ENCOUNTER — Inpatient Hospital Stay (HOSPITAL_COMMUNITY): Payer: Medicaid Other

## 2013-04-13 LAB — BASIC METABOLIC PANEL
BUN: 5 mg/dL — ABNORMAL LOW (ref 6–23)
CO2: 26 mEq/L (ref 19–32)
Calcium: 9.1 mg/dL (ref 8.4–10.5)
Chloride: 99 mEq/L (ref 96–112)
Creatinine, Ser: 0.44 mg/dL — ABNORMAL LOW (ref 0.50–1.10)
GFR calc Af Amer: >90 mL/min (ref >90)
GFR calc non Af Amer: >90 mL/min (ref >90)
Glucose, Bld: 97 mg/dL (ref 70–99)
Potassium: 3.1 mEq/L — ABNORMAL LOW (ref 3.5–5.1)
Sodium: 138 mEq/L (ref 135–145)

## 2013-04-13 LAB — GLUCOSE, CAPILLARY
Glucose-Capillary: 145 mg/dL — ABNORMAL HIGH (ref 70–99)
Glucose-Capillary: 86 mg/dL (ref 70–99)
Glucose-Capillary: 112 mg/dL — ABNORMAL HIGH (ref 70–99)
Glucose-Capillary: 69 mg/dL — ABNORMAL LOW (ref 70–99)

## 2013-04-13 LAB — CBC
HCT: 32 % — ABNORMAL LOW (ref 36.0–46.0)
Hemoglobin: 11 g/dL — ABNORMAL LOW (ref 12.0–15.0)
MCH: 32.1 pg (ref 26.0–34.0)
MCHC: 34.4 g/dL (ref 30.0–36.0)
MCV: 93.3 fL (ref 78.0–100.0)
Platelets: 170 10*3/uL (ref 150–400)
RBC: 3.43 MIL/uL — ABNORMAL LOW (ref 3.87–5.11)
RDW: 12.8 % (ref 11.5–15.5)
WBC: 8.4 10*3/uL (ref 4.0–10.5)

## 2013-04-13 MED ORDER — ACETAMINOPHEN 325 MG PO TABS: 650.0000 mg | ORAL_TABLET | Freq: Four times a day (QID) | ORAL | Status: DC | PRN

## 2013-04-13 MED ORDER — METFORMIN HCL 500 MG PO TABS
500.0000 mg | ORAL_TABLET | Freq: Two times a day (BID) | ORAL | Status: DC
  Administered 2013-04-13 – 2013-04-14 (×3): 500 mg via ORAL
  Filled 2013-04-13 (×7): qty 1

## 2013-04-13 MED ORDER — ONDANSETRON HCL 4 MG/2ML IJ SOLN: 4.0000 mg | Freq: Four times a day (QID) | INTRAMUSCULAR | Status: DC | PRN

## 2013-04-13 MED ORDER — MIDAZOLAM HCL 2 MG/2ML IJ SOLN
2.0000 mg | Freq: Once | INTRAMUSCULAR | Status: DC
  Filled 2013-04-13: qty 2

## 2013-04-13 MED ORDER — SODIUM CHLORIDE 0.9 % IV SOLN: 250.0000 mL | INTRAVENOUS | Status: DC | PRN

## 2013-04-13 MED ORDER — POTASSIUM CHLORIDE 10 MEQ/50ML IV SOLN
10.0000 meq | INTRAVENOUS | Status: DC | PRN
  Administered 2013-04-13: 10 meq via INTRAVENOUS
  Filled 2013-04-13: qty 50

## 2013-04-13 MED ORDER — POTASSIUM CHLORIDE CRYS ER 20 MEQ PO TBCR: 40.0000 meq | EXTENDED_RELEASE_TABLET | Freq: Once | ORAL | Status: AC

## 2013-04-13 MED ORDER — BISACODYL 10 MG RE SUPP: 10.0000 mg | Freq: Every day | RECTAL | Status: DC | PRN

## 2013-04-13 MED ORDER — TRAMADOL HCL 50 MG PO TABS
50.0000 mg | ORAL_TABLET | ORAL | Status: DC | PRN
  Administered 2013-04-14: 100 mg via ORAL
  Filled 2013-04-13: qty 2

## 2013-04-13 MED ORDER — SODIUM CHLORIDE 0.9 % IJ SOLN
3.0000 mL | Freq: Two times a day (BID) | INTRAMUSCULAR | Status: DC
  Administered 2013-04-13 – 2013-04-14 (×2): 3 mL via INTRAVENOUS

## 2013-04-13 MED ORDER — GLYBURIDE 5 MG PO TABS
5.0000 mg | ORAL_TABLET | Freq: Two times a day (BID) | ORAL | Status: DC
  Administered 2013-04-13 – 2013-04-14 (×3): 5 mg via ORAL
  Filled 2013-04-13 (×7): qty 1

## 2013-04-13 MED ORDER — DOCUSATE SODIUM 100 MG PO CAPS
200.0000 mg | ORAL_CAPSULE | Freq: Every day | ORAL | Status: DC
  Administered 2013-04-14: 200 mg via ORAL
  Filled 2013-04-13 (×2): qty 2

## 2013-04-13 MED ORDER — OXYCODONE HCL 5 MG PO TABS
5.0000 mg | ORAL_TABLET | ORAL | Status: DC | PRN
  Administered 2013-04-14: 10 mg via ORAL
  Filled 2013-04-13: qty 2

## 2013-04-13 MED ORDER — FAMOTIDINE 20 MG PO TABS
20.0000 mg | ORAL_TABLET | Freq: Two times a day (BID) | ORAL | Status: DC
  Administered 2013-04-13 – 2013-04-14 (×3): 20 mg via ORAL
  Filled 2013-04-13 (×5): qty 1

## 2013-04-13 MED ORDER — SODIUM CHLORIDE 0.9 % IJ SOLN: 3.0000 mL | INTRAMUSCULAR | Status: DC | PRN

## 2013-04-13 MED ORDER — METOPROLOL TARTRATE 12.5 MG HALF TABLET
12.5000 mg | ORAL_TABLET | Freq: Two times a day (BID) | ORAL | Status: DC
  Administered 2013-04-13 – 2013-04-14 (×3): 12.5 mg via ORAL
  Filled 2013-04-13 (×5): qty 1

## 2013-04-13 MED ORDER — BISACODYL 5 MG PO TBEC: 10.0000 mg | DELAYED_RELEASE_TABLET | Freq: Every day | ORAL | Status: DC | PRN

## 2013-04-13 MED ORDER — MOVING RIGHT ALONG BOOK
Freq: Once | Status: AC
  Administered 2013-04-13: 14:00:00
  Filled 2013-04-13: qty 1

## 2013-04-13 MED ORDER — ONDANSETRON HCL 4 MG PO TABS: 4.0000 mg | ORAL_TABLET | Freq: Four times a day (QID) | ORAL | Status: DC | PRN

## 2013-04-13 MED ORDER — ASPIRIN EC 325 MG PO TBEC
325.0000 mg | DELAYED_RELEASE_TABLET | Freq: Every day | ORAL | Status: DC
  Administered 2013-04-13 – 2013-04-14 (×2): 325 mg via ORAL
  Filled 2013-04-13 (×3): qty 1

## 2013-04-13 MED FILL — Potassium Chloride Inj 2 mEq/ML: INTRAVENOUS | Qty: 40 | Status: AC

## 2013-04-13 MED FILL — Mannitol IV Soln 20%: INTRAVENOUS | Qty: 500 | Status: AC

## 2013-04-13 MED FILL — Lidocaine HCl IV Inj 20 MG/ML: INTRAVENOUS | Qty: 5 | Status: AC

## 2013-04-13 MED FILL — Electrolyte-R (PH 7.4) Solution: INTRAVENOUS | Qty: 4000 | Status: AC

## 2013-04-13 MED FILL — Magnesium Sulfate Inj 50%: INTRAMUSCULAR | Qty: 10 | Status: AC

## 2013-04-13 MED FILL — Sodium Bicarbonate IV Soln 8.4%: INTRAVENOUS | Qty: 50 | Status: AC

## 2013-04-13 MED FILL — Heparin Sodium (Porcine) Inj 1000 Unit/ML: INTRAMUSCULAR | Qty: 10 | Status: AC

## 2013-04-13 MED FILL — Sodium Chloride IV Soln 0.9%: INTRAVENOUS | Qty: 50 | Status: AC

## 2013-04-13 MED FILL — Heparin Sodium (Porcine) Inj 1000 Unit/ML: INTRAMUSCULAR | Qty: 30 | Status: AC

## 2013-04-13 MED FILL — Sodium Chloride IV Soln 0.9%: INTRAVENOUS | Qty: 1000 | Status: AC

## 2013-04-13 NOTE — Progress Notes (Signed)
3 Days Post-Op Procedure(s) (LRB): CORONARY ARTERY BYPASS GRAFTING (CABG) (N/A) INTRAOPERATIVE TRANSESOPHAGEAL ECHOCARDIOGRAM (N/A) Subjective: No complaints  Objective: Vital signs in last 24 hours: Temp:  [97.9 F (36.6 C)-99.7 F (37.6 C)] 98.6 F (37 C) (11/24 0743) Pulse Rate:  [77-98] 92 (11/24 0800) Cardiac Rhythm:  [-] Normal sinus rhythm (11/24 0800) Resp:  [9-31] 23 (11/24 0800) BP: (85-128)/(46-75) 109/57 mmHg (11/24 0800) SpO2:  [91 %-100 %] 94 % (11/24 0800) Weight:  [59.6 kg (131 lb 6.3 oz)] 59.6 kg (131 lb 6.3 oz) (11/24 0600)  Hemodynamic parameters for last 24 hours:    Intake/Output from previous day: 11/23 0701 - 11/24 0700 In: 370 [P.O.:100; I.V.:120; IV Piggyback:150] Out: 2045 [Urine:2005; Chest Tube:40] Intake/Output this shift:    General appearance: alert and answers questions briefly. Does not know who I am. Seems to be at baseline. Neurologic: intact Heart: regular rate and rhythm, S1, S2 normal, no murmur, click, rub or gallop Lungs: rales bibasilar Extremities: extremities normal, atraumatic, no cyanosis or edema Wound: incision ok  Lab Results:  Recent Labs  04/12/13 0424 04/13/13 0400  WBC 10.1 8.4  HGB 11.4* 11.0*  HCT 33.5* 32.0*  PLT 171 170   BMET:  Recent Labs  04/12/13 0424 04/13/13 0400  NA 136 138  K 3.8 3.1*  CL 100 99  CO2 23 26  GLUCOSE 109* 97  BUN 6 5*  CREATININE 0.51 0.44*  CALCIUM 9.4 9.1    PT/INR:  Recent Labs  04/10/13 1230  LABPROT 16.8*  INR 1.40   ABG    Component Value Date/Time   PHART 7.351 04/10/2013 2236   HCO3 24.8* 04/10/2013 2236   TCO2 24 04/11/2013 1621   ACIDBASEDEF 1.0 04/10/2013 2236   O2SAT 97.0 04/10/2013 2236   CBG (last 3)   Recent Labs  04/13/13 0005 04/13/13 0420 04/13/13 0741  GLUCAP 89 86 112*    Assessment/Plan: S/P Procedure(s) (LRB): CORONARY ARTERY BYPASS GRAFTING (CABG) (N/A) INTRAOPERATIVE TRANSESOPHAGEAL ECHOCARDIOGRAM (N/A) Mobilize Diabetes  control: glucose under good control. She is eating so will put her back on oral regimen.  Seizure disorder on dilantin. Will check dilantin level tomorrow. Mental status seems to be at baseline. Plan for transfer to step-down: see transfer orders Will need to work on placement. She says she is going home with her mother but I don't know if her family is planning to take her home or if she will need SNF.    LOS: 10 days    Evelene Croon K 04/13/2013

## 2013-04-13 NOTE — Plan of Care (Signed)
Problem: Phase III Progression Outcomes Goal: Time patient transferred to PCTU/Telemetry POD Outcome: Completed/Met Date Met:  04/13/13 Transfer 04/13/13 1300

## 2013-04-13 NOTE — Progress Notes (Signed)
CARDIAC REHAB PHASE I   PRE:  Rate/Rhythm: 88SR  BP:  Supine:   Sitting: 100/60  Standing:    SaO2: 95%RA  MODE:  Ambulation: 410 ft   POST:  Rate/Rhythm: 94  BP:  Supine:   Sitting: 90/52  Standing:    SaO2: 95%RA 1330-1350 Pt c/o feeling cold. Turned up temperature in room and gave more blankets. Walked 410 ft on RA with rolling walker and asst x 2. Pt steady but does not adhere to sternal precautions. Re enforced them several times but pt still used arms in bed. Back to bed. Will keep as asst x 2.   Luetta Nutting, RN BSN  04/13/2013 1:45 PM

## 2013-04-14 ENCOUNTER — Encounter (HOSPITAL_COMMUNITY): Payer: Self-pay | Admitting: Surgery

## 2013-04-14 LAB — CBC
HCT: 32.3 % — ABNORMAL LOW (ref 36.0–46.0)
Hemoglobin: 11.2 g/dL — ABNORMAL LOW (ref 12.0–15.0)
MCHC: 34.7 g/dL (ref 30.0–36.0)
Platelets: 218 10*3/uL (ref 150–400)
RDW: 12.8 % (ref 11.5–15.5)
WBC: 6.9 10*3/uL (ref 4.0–10.5)

## 2013-04-14 LAB — BASIC METABOLIC PANEL
Chloride: 98 mEq/L (ref 96–112)
GFR calc Af Amer: >90 mL/min (ref >90)
GFR calc non Af Amer: >90 mL/min (ref >90)
Glucose, Bld: 87 mg/dL (ref 70–99)
Potassium: 3.4 mEq/L — ABNORMAL LOW (ref 3.5–5.1)
Sodium: 138 mEq/L (ref 135–145)

## 2013-04-14 LAB — GLUCOSE, CAPILLARY
Glucose-Capillary: 92 mg/dL (ref 70–99)
Glucose-Capillary: 84 mg/dL (ref 70–99)
Glucose-Capillary: 102 mg/dL — ABNORMAL HIGH (ref 70–99)

## 2013-04-14 MED ORDER — POTASSIUM CHLORIDE CRYS ER 20 MEQ PO TBCR
40.0000 meq | EXTENDED_RELEASE_TABLET | Freq: Once | ORAL | Status: AC
  Administered 2013-04-14: 40 meq via ORAL
  Filled 2013-04-14: qty 2

## 2013-04-14 NOTE — Discharge Summary (Signed)
[address]       [place] [address]             [phone]              Discharge Summary  Name: [name] [name] DOB: 11/18/1950 62 y.o. MRN: [id number]   Admission Date: 04/03/2013 Discharge Date:     Admitting Diagnosis: Chest/jaw pain   Discharge Diagnosis:  Non-ST elevation myocardial infarction Severe multi-vessel coronary artery disease Expected postoperative blood loss anemia  Past Medical History  Diagnosis Date  . Diabetes mellitus without complication   . Seizure disorder   . Tobacco abuse   . Ischemic cardiomyopathy   . Acute thrombus of left ventricle   . Memory deficits   . Schizo affective schizophrenia   . Epilepsy       Procedures: CORONARY ARTERY BYPASS GRAFTING x 4 (Left internal mammary artery to left anterior descending, sequential saphenous vein graft to obtuse marginals 2 and 3, saphenous vein graft to diagonal) ENDOSCOPIC VEIN HARVEST RIGHT LEG - 04/10/2013   HPI:  The patient is a 62 y.o. female with a several week history of intermittent substernal chest discomfort radiating to her abdomen, back and jaw.  She presented to the ER in [facility] for evaluation and was noted to have an elevated troponin.  She was subsequently transferred to the ER at [facility] for cardiology evaluation.  She ruled in for a NSTEMI, and was admitted for further treatment.   Hospital Course:  The patient was admitted to [facility] on 04/03/2013.  She was started on heparin, aspirin, a beta blocker, and statin.  She remained stable and pain resolved.  She underwent cardiac catheterization by Dr. Gala Romney which showed severe 3 vessel CAD with severe left ventricular dysfunction, EF 25% and apical clot.  A cardiac surgery consult was requested and Dr. Laneta Simmers saw the patient.  It was felt that she was high risk for surgery based on her poor LV function, diffusely diseased vessels, and poorly controlled diabetes, however, it was felt  that surgical revascularization was her best option.  All risks, benefits and alternatives of surgery were explained in detail, and the patient agreed to proceed.    The patient was taken to the operating room and underwent the above procedure.    The postoperative course was notable for early confusion and agitation, which were treated conservatively.  At this point, she seems to have returned to her baseline mental status.  She has remained stable from a cardiac standpoint.  Blood pressures have been controlled and she is maintaining normal sinus rhythm.  She has been restarted on her home diabetes regimen with good control (hemoglobin A1C=11.9).  She has had a mild postop blood loss anemia which has not required transfusion.  She is ambulating in the halls without difficulty and tolerating a regular diet.  Incisions are healing well.  She was medically stable for discharge 04/15/2013.  She will follow up at [facility] in 3 weeks with a CXR prior to appointment.     Recent vital signs:  Filed Vitals:   04/15/13 0603  BP: 137/52  Pulse: 83  Temp: 98.2 F (36.8 C)  Resp: 20    Recent laboratory studies:  CBC:  Recent Labs  04/13/13 0400 04/14/13 0540  WBC 8.4 6.9  HGB 11.0* 11.2*  HCT 32.0* 32.3*  PLT 170 218   BMET:   Recent Labs  04/13/13 0400 04/14/13 0540  NA 138 138  K 3.1* 3.4*  CL 99 98  CO2 26 24  GLUCOSE 97 87  BUN 5* 7  CREATININE 0.44* 0.43*  CALCIUM 9.1 9.0    PT/INR: No results found for this basename: LABPROT, INR,  in the last 72 hours   Discharge Medications:    The patient has been discharged on:   1.Beta Blocker:  Yes [ x  ]                              No   [   ]                              If No, reason:  2.Ace Inhibitor/ARB: Yes [   ]                                     No  [ x   ]                                     If No, reason: Labile blood pressure, will reassess   3.Statin:   Yes [ x  ]                  No  [   ]                  If  No, reason:  4.Ecasa:  Yes  [ x  ]                  No   [   ]                  If No, reason:    Medication List         aspirin 325 MG EC tablet  Take 1 tablet (325 mg total) by mouth daily.     glyBURIDE-metformin 5-500 MG per tablet  Commonly known as:  GLUCOVANCE  Take 1 tablet by mouth 2 (two) times daily with a meal.     metoprolol tartrate 25 MG tablet  Commonly known as:  LOPRESSOR  Take 0.5 tablets (12.5 mg total) by mouth 2 (two) times daily.     phenytoin 100 MG ER capsule  Commonly known as:  DILANTIN  Take 300 mg by mouth at bedtime.     simvastatin 40 MG tablet  Commonly known as:  ZOCOR  Take 40 mg by mouth daily.     traMADol 50 MG tablet  Commonly known as:  ULTRAM  Take 1-2 tablets (50-100 mg total) by mouth every 4 (four) hours as needed for moderate pain.        Discharge Instructions:  The patient is to refrain from driving, heavy lifting or strenuous activity.  May shower daily and clean incisions with soap and water.  May resume regular diet.   Follow Up:    Follow-up Information   Follow up with Alleen Borne, MD In 3 weeks. (office will contact you with appointment date and time)    Specialty:  Cardiothoracic Surgery   Contact information:   [address] [place] [address] [phone]       Follow up with [facility] In  3 weeks. (Please get CXR 1 hour prior to appointment)    Contact information:   [place]        BARRETT, ERIN 04/15/2013, 8:51 AM

## 2013-04-14 NOTE — Progress Notes (Addendum)
       [address]       [place] [address]             [phone]          4 Days Post-Op Procedure(s) (LRB): CORONARY ARTERY BYPASS GRAFTING (CABG) (N/A) INTRAOPERATIVE TRANSESOPHAGEAL ECHOCARDIOGRAM (N/A)  Subjective: "I'm leaving as soon as my family gets here!  I don't need to be here!"  No physical complaints this am.    Objective: Vital signs in last 24 hours: Patient Vitals for the past 24 hrs:  BP Temp Temp src Pulse Resp SpO2 Weight  04/14/13 0511 97/62 mmHg 98.1 F (36.7 C) Oral 79 18 96 % 130 lb 11.7 oz (59.3 kg)  04/13/13 2036 111/52 mmHg 99 F (37.2 C) Oral 90 18 98 % -  04/13/13 1330 104/65 mmHg 98.7 F (37.1 C) Oral 77 18 95 % -  04/13/13 1200 127/72 mmHg - - 95 16 97 % -  04/13/13 1144 - 98.5 F (36.9 C) Oral - - - -  04/13/13 1100 112/62 mmHg - - 89 21 96 % -  04/13/13 1000 96/46 mmHg - - 87 22 95 % -  04/13/13 0900 105/54 mmHg - - 99 20 97 % -   Current Weight  04/14/13 130 lb 11.7 oz (59.3 kg)   PRE OP WEIGHT: 61 kg   Intake/Output from previous day: 11/24 0701 - 11/25 0700 In: 1680 [P.O.:1680] Out: -   CBGs 56-21-30    PHYSICAL EXAM:  Heart: RRR Lungs: Clear Wound: Clean and dry Extremities: No LE edema    Lab Results: CBC: Recent Labs  04/13/13 0400 04/14/13 0540  WBC 8.4 6.9  HGB 11.0* 11.2*  HCT 32.0* 32.3*  PLT 170 218   BMET:  Recent Labs  04/13/13 0400 04/14/13 0540  NA 138 138  K 3.1* 3.4*  CL 99 98  CO2 26 24  GLUCOSE 97 87  BUN 5* 7  CREATININE 0.44* 0.43*  CALCIUM 9.1 9.0    PT/INR: No results found for this basename: LABPROT, INR,  in the last 72 hours  CXR: FINDINGS:  Bilateral chest tubes have been removed. No evidence for a  pneumothorax. Right jugular central line is in the SVC region. Hazy  densities at the left lung base may represent atelectasis. Heart  size is normal. Median sternotomy wires are present.  IMPRESSION:  Removal of bilateral chest tubes without a  pneumothorax.  Probable left basilar atelectasis.   Assessment/Plan: S/P Procedure(s) (LRB): CORONARY ARTERY BYPASS GRAFTING (CABG) (N/A) INTRAOPERATIVE TRANSESOPHAGEAL ECHOCARDIOGRAM (N/A)  CV- BP, HR stable.  Maintaining SR.  Neuro- Agitated, but appears that MS at baseline. Dilantin level 9.3  DM- back on po meds, sugars stable.  Hypokalemia- replace K+.  Will d/c EPWs and CT sutures.  Continue CRPI, pulm toilet.  Disp- will have CM confirm pt's d/c status.  She states she is going home with her mother, but no family here at present. ?may need SNF.    LOS: 11 days    COLLINS,GINA H 04/14/2013   Chart reviewed, patient examined, agree with above. Dilantin level is a little low but she is back on preop dose. She missed a couple doses periop so this should be ok.

## 2013-04-14 NOTE — Progress Notes (Signed)
Patient's son and wife and children came to visit patient tonight. They convinced her to take her medications this evening. I replaced her armband which she had pulled off. She is resting in bed.

## 2013-04-14 NOTE — Evaluation (Signed)
Physical Therapy Evaluation Patient Details Name: [name] [name] MRN: [id number] DOB: 1950-06-01 Today's Date: 04/14/2013 Time: 4098-1191 PT Time Calculation (min): 26 min  PT Assessment / Plan / Recommendation History of Present Illness  Pt is 62 yo female s/p CABG  Clinical Impression  Patient is independent with mobility. No Acute PT needs, PT will sign off.    PT Assessment  Patent does not need any further PT services    Follow Up Recommendations  Supervision - Intermittent    Does the patient have the potential to tolerate intense rehabilitation      Barriers to Discharge        Equipment Recommendations  None recommended by PT    Recommendations for Other Services     Frequency      Precautions / Restrictions Precautions Precautions: Sternal Restrictions Weight Bearing Restrictions: No (sternal precautions)   Pertinent Vitals/Pain 4.10      Mobility  Bed Mobility Bed Mobility: Supine to Sit;Sitting - Scoot to Edge of Bed;Sit to Supine Supine to Sit: 6: Modified independent (Device/Increase time) Sitting - Scoot to Edge of Bed: 6: Modified independent (Device/Increase time) Sit to Supine: 6: Modified independent (Device/Increase time) Details for Bed Mobility Assistance: Increased time to perform Transfers Transfers: Sit to Stand;Stand to Sit Sit to Stand: 6: Modified independent (Device/Increase time) Stand to Sit: 6: Modified independent (Device/Increase time) Details for Transfer Assistance: increased time to perform Ambulation/Gait Ambulation/Gait Assistance: 7: Independent Ambulation Distance (Feet): 500 Feet Assistive device: None Ambulation/Gait Assistance Details: independent with ambulation General Gait Details: steady Stairs: Yes Stairs Assistance: 5: Supervision Stair Management Technique: No rails;Alternating pattern Number of Stairs: 12    Exercises     PT Diagnosis:    PT Problem List:   PT Treatment Interventions:       PT  Goals(Current goals can be found in the care plan section) Acute Rehab PT Goals PT Goal Formulation: No goals set, d/c therapy  Visit Information  Last PT Received On: 04/14/13 Assistance Needed: +1 History of Present Illness: Pt is 62 yo female s/p CABG       Prior Functioning  Home Living Family/patient expects to be discharged to:: Private residence Living Arrangements: Children Available Help at Discharge: Family Type of Home: House Home Access: Stairs to enter Secretary/administrator of Steps: 3 Entrance Stairs-Rails: Can reach both Home Layout: One level Home Equipment: None Prior Function Level of Independence: Independent Communication Communication: No difficulties Dominant Hand: Right    Cognition  Cognition Arousal/Alertness: Awake/alert Behavior During Therapy: WFL for tasks assessed/performed Overall Cognitive Status: Within Functional Limits for tasks assessed    Extremity/Trunk Assessment Upper Extremity Assessment Upper Extremity Assessment:  (hx of peripherpal neuropathy) Lower Extremity Assessment Lower Extremity Assessment: Overall WFL for tasks assessed   Balance High Level Balance High Level Balance Activites: Side stepping;Backward walking;Direction changes;Turns;Head turns High Level Balance Comments: independent  End of Session PT - End of Session Equipment Utilized During Treatment: Gait belt Activity Tolerance: Patient tolerated treatment well Patient left: in bed;with family/visitor present Nurse Communication: Mobility status  GP     Fabio Asa 04/14/2013, 2:31 PM  Charlotte Crumb, PT DPT  [phone]

## 2013-04-14 NOTE — Progress Notes (Signed)
Pt consistently refused to have EPW DC'd today.  Told nurse "Don't f----- touch me", and also had visitors that she did nto want me to dismiss and said "you're so rude, i'll tell you when they're gone and I'm ready".  Attempted to upll wires around 1530 once visitors left and patient finally complied.  Ventricle wire met resistance and bled.  Patient screamed and pulled RNs hands away.  MD on floor made aware, wire taped, will reattempt tomorrow.  VSS. Patient instructed to remain in bed x 1 hr.  Ave Filter

## 2013-04-14 NOTE — Progress Notes (Signed)
Patient woke up with the bed alarm going off. Patient had packed up her belongings and said she was getting ready to go home.  Patient was reoriented by nurse tech and explained that it was still night time. Patient settled back down. Door kept open. Will continue to monitor.

## 2013-04-14 NOTE — Progress Notes (Signed)
CARDIAC REHAB PHASE I   PRE:  Rate/Rhythm: 86 SR    BP: sitting 100/46    SaO2: 98 RA  MODE:  Ambulation: 550 ft   POST:  Rate/Rhythm: 94 SR    BP: sitting 110/60     SaO2: 100 RA  Pt moving very well. Quick, fairly steady pace. Supervision assist, no device. No c/o walking. Wants to go home with her mother. Will need family for ed as she is not capable of remembering all of information. Will f/u. [id number]   Elissa Lovett Downs CES, ACSM 04/14/2013 11:14 AM

## 2013-04-15 LAB — GLUCOSE, CAPILLARY: Glucose-Capillary: 92 mg/dL (ref 70–99)

## 2013-04-15 MED ORDER — METOPROLOL TARTRATE 25 MG PO TABS: 12.5000 mg | ORAL_TABLET | Freq: Two times a day (BID) | ORAL | Status: DC

## 2013-04-15 MED ORDER — ASPIRIN 325 MG PO TBEC: 325.0000 mg | DELAYED_RELEASE_TABLET | Freq: Every day | ORAL | Status: DC

## 2013-04-15 MED ORDER — TRAMADOL HCL 50 MG PO TABS: 50.0000 mg | ORAL_TABLET | ORAL | Status: DC | PRN

## 2013-04-15 NOTE — Progress Notes (Signed)
[id number] Cardiac Rehab Completed discharge education with pt and daughter. Pt restless and very anxious to go home. I was unable to her to sit down and listen. Pt was up walking in room and getting a bath, putting on her clothes. Completed education mostly with her daughter. Pt's daughter agrees to Outpt. CRP in [facility], will send referral. Discussed smoking cessation and gave her tips for quitting and coaching contact number. She does not seem committed to quitting. Melina Copa G.now 04/15/2013

## 2013-04-15 NOTE — Progress Notes (Signed)
Ventricle EPW met resistance yesterday, taped and left for today to retry.  Still much resistance in tissues, PA on floor called to assist.  Wire removed, some bleeding noted, patient upset but reassured.  Instructed to remain in bed x 1 hr, frequent vitals initiated, first set VSS, wire end intact, will continue to monitor closely.  Ave Filter

## 2013-04-15 NOTE — Progress Notes (Addendum)
      [address]       [place] [address]             [phone]      5 Days Post-Op Procedure(s) (LRB): CORONARY ARTERY BYPASS GRAFTING (CABG) (N/A) INTRAOPERATIVE TRANSESOPHAGEAL ECHOCARDIOGRAM (N/A)  Subjective:  [name] [name] says get out of my face.  She states she has never experienced anything like this before.  We are a rude facility and she could've went to [facility].  She refused her morning medications this morning.  She refused physical exam.  She wants to go home  Objective: Vital signs in last 24 hours: Temp:  [97.9 F (36.6 C)-98.2 F (36.8 C)] 98.2 F (36.8 C) (11/26 0603) Pulse Rate:  [75-83] 83 (11/26 0603) Cardiac Rhythm:  [-] Normal sinus rhythm (11/25 1927) Resp:  [18-20] 20 (11/26 0603) BP: (104-137)/(45-59) 137/52 mmHg (11/26 0603) SpO2:  [99 %] 99 % (11/26 0603) Weight:  [131 lb 8 oz (59.648 kg)] 131 lb 8 oz (59.648 kg) (11/26 0603)  Intake/Output from previous day: 11/25 0701 - 11/26 0700 In: 600 [P.O.:600] Out: -   Patient refused  Lab Results:  Recent Labs  04/13/13 0400 04/14/13 0540  WBC 8.4 6.9  HGB 11.0* 11.2*  HCT 32.0* 32.3*  PLT 170 218   BMET:  Recent Labs  04/13/13 0400 04/14/13 0540  NA 138 138  K 3.1* 3.4*  CL 99 98  CO2 26 24  GLUCOSE 97 87  BUN 5* 7  CREATININE 0.44* 0.43*  CALCIUM 9.1 9.0    PT/INR: No results found for this basename: LABPROT, INR,  in the last 72 hours ABG    Component Value Date/Time   PHART 7.351 04/10/2013 2236   HCO3 24.8* 04/10/2013 2236   TCO2 24 04/11/2013 1621   ACIDBASEDEF 1.0 04/10/2013 2236   O2SAT 97.0 04/10/2013 2236   CBG (last 3)   Recent Labs  04/14/13 1114 04/14/13 1629 04/15/13 0602  GLUCAP 84 102* 92    Assessment/Plan: S/P Procedure(s) (LRB): CORONARY ARTERY BYPASS GRAFTING (CABG) (N/A) INTRAOPERATIVE TRANSESOPHAGEAL ECHOCARDIOGRAM (N/A)  1. CV- CV- NSR, BP mildly elevated- on Lopressor  2. Pulm- off oxygen, continue IS  3. Psych-  patient with mental illness per family, disoriented, agitated at times 4. DM- uncontrolled as outpatient A1C, sugars controlled in hospital 5. Deconditioning- patient will require 24 hr a day care, her daughter and family will provide this 6. Dispo- EPW removed this morning, d/c home today if okay with staff  LOS: 12 days    [name] [name] 04/15/2013

## 2013-04-15 NOTE — Progress Notes (Signed)
Patient woke up agitated, verbally aggressive said staff were "rootworkers" and that we were putting roots on her.  She wanted to know when she was getting out of here. Educated patient on her plan of care. Called patient's son so he could talk with her and calm her down.  I have had to call him twice tonight to get her to calm down. Will continue to monitor.

## 2013-04-24 ENCOUNTER — Other Ambulatory Visit: Payer: Self-pay | Admitting: Physician Assistant

## 2013-04-27 ENCOUNTER — Other Ambulatory Visit: Payer: Self-pay | Admitting: *Deleted

## 2013-04-27 DIAGNOSIS — G8918 Other acute postprocedural pain: Secondary | ICD-10-CM

## 2013-04-27 MED ORDER — TRAMADOL HCL 50 MG PO TABS: 50.0000 mg | ORAL_TABLET | Freq: Four times a day (QID) | ORAL | Status: DC | PRN

## 2013-04-30 ENCOUNTER — Other Ambulatory Visit: Payer: Self-pay | Admitting: Physician Assistant

## 2013-05-01 ENCOUNTER — Other Ambulatory Visit: Payer: Self-pay | Admitting: *Deleted

## 2013-05-01 DIAGNOSIS — I2510 Atherosclerotic heart disease of native coronary artery without angina pectoris: Secondary | ICD-10-CM

## 2013-05-01 DIAGNOSIS — G8918 Other acute postprocedural pain: Secondary | ICD-10-CM

## 2013-05-01 MED ORDER — TRAMADOL HCL 50 MG PO TABS: 50.0000 mg | ORAL_TABLET | Freq: Four times a day (QID) | ORAL | Status: DC | PRN

## 2013-05-06 ENCOUNTER — Encounter: Payer: Self-pay | Admitting: Surgery

## 2013-05-06 ENCOUNTER — Ambulatory Visit (INDEPENDENT_AMBULATORY_CARE_PROVIDER_SITE_OTHER): Payer: Self-pay | Admitting: Surgery

## 2013-05-06 ENCOUNTER — Ambulatory Visit
Admission: RE | Admit: 2013-05-06 | Discharge: 2013-05-06 | Disposition: A | Discharge status: Home / Self Care | Payer: Medicaid Other | Source: Ambulatory Visit | Attending: Surgery | Admitting: Surgery

## 2013-05-06 VITALS — BP 105/65 | HR 72 | Resp 18 | Ht 63.0 in | Wt 131.0 lb

## 2013-05-06 DIAGNOSIS — I2510 Atherosclerotic heart disease of native coronary artery without angina pectoris: Secondary | ICD-10-CM

## 2013-05-06 DIAGNOSIS — Z951 Presence of aortocoronary bypass graft: Secondary | ICD-10-CM

## 2013-05-06 NOTE — Progress Notes (Signed)
      HPI:  Patient returns for routine postoperative follow-up having undergone CABG x 4  on 04/10/2013. The patient's early postoperative recovery while in the hospital was notable for an uncomplicated postop course. Since hospital discharge the patient reports that she has been feeling well. She has continued to abstain from smoking and is watching her diet closely.   Current Outpatient Prescriptions  Medication Sig Dispense Refill  . aspirin EC 325 MG EC tablet Take 1 tablet (325 mg total) by mouth daily.  30 tablet  0  . glyBURIDE-metformin (GLUCOVANCE) 5-500 MG per tablet Take 1 tablet by mouth 2 (two) times daily with a meal.      . metoprolol tartrate (LOPRESSOR) 25 MG tablet Take 0.5 tablets (12.5 mg total) by mouth 2 (two) times daily.  30 tablet  3  . phenytoin (DILANTIN) 100 MG ER capsule Take 300 mg by mouth at bedtime.      . simvastatin (ZOCOR) 40 MG tablet Take 40 mg by mouth daily.       No current facility-administered medications for this visit.    Physical Exam: BP 105/65  Pulse 72  Resp 18  Ht 5\' 3"  (1.6 m)  Wt 131 lb (59.421 kg)  BMI 23.21 kg/m2  SpO2 97% He looks well. Lung exam is clear. Cardiac exam shows a regular rate and rhythm with normal heart sounds. Chest incision is healing well and sternum is stable. The leg incisions are healing well and there is no peripheral edema.    Diagnostic Tests:  CLINICAL DATA: Chest pain patient with history of CABG x2 weeks  EXAM:  CHEST 2 VIEW  COMPARISON: 04/13/2013  FINDINGS:  Patient is status post median sternotomy and coronary artery bypass  grafting. The cardiac silhouette is mildly enlarged. No focal reason  consolidation or focal infiltrates appreciated. The osseous  structures are unremarkable.  IMPRESSION:  No evidence of acute cardiopulmonary disease.  Electronically Signed  By: Salome Holmes M.D.  On: 05/06/2013 14:48   Impression:  She is doing well following surgery. I encouraged  her to continue ambulating as much as possible and to watch her diet and keep her diabetes under control. I asked her not to lift anything heavier than 10 lbs for 3 months postop.  Plan:  She will continue to follow-up with cardiology and will contact me if she develops any problems with her incisions.

## 2013-06-03 ENCOUNTER — Ambulatory Visit: Payer: Medicaid Other | Admitting: Cardiovascular Disease

## 2013-07-03 ENCOUNTER — Ambulatory Visit (INDEPENDENT_AMBULATORY_CARE_PROVIDER_SITE_OTHER): Payer: Medicaid Other | Admitting: Cardiovascular Disease

## 2013-07-03 ENCOUNTER — Encounter: Payer: Self-pay | Admitting: Cardiovascular Disease

## 2013-07-03 VITALS — BP 90/70 | HR 70 | Ht 63.0 in | Wt 139.4 lb

## 2013-07-03 DIAGNOSIS — I509 Heart failure, unspecified: Secondary | ICD-10-CM

## 2013-07-03 DIAGNOSIS — I5022 Chronic systolic (congestive) heart failure: Secondary | ICD-10-CM

## 2013-07-03 DIAGNOSIS — I2510 Atherosclerotic heart disease of native coronary artery without angina pectoris: Secondary | ICD-10-CM

## 2013-07-03 NOTE — Assessment & Plan Note (Addendum)
I have encouraged her  to stop smoking. We reviewed a healthy diet. We will see her again in 6 months.  Check fasting labs at that time. Will also get an echocardiogram to assess her left ventricular function.

## 2013-07-03 NOTE — Progress Notes (Signed)
[name] MooresBetty [name] Date of Birth  07/09/1950       [facility]    [facility] [address]  [address], [address] [place], [place]  [address]   [place]  [id number] [phone]     [phone]   Fax  [phone]    Fax [phone]  Problem List: 1. Coronary artery disease-status post coronary artery bypass grafting 2. Ischemic cardiomyopathy - chronic systolic CHf - EF 25% 3. Diabetes mellitus 4. Hyperlipidemia 5. Schizoaffective disorder   History of Present Illness:  Feb. 13, 2015: [name] [name]  and is a 63 year old female was recently admitted to the hospital with chest pain.  Her son have a non-ST segment elevation myocardial infarction. Cardiac cath  revealed severe coronary artery disease. She had coronary artery bypass grafting by Dr. Laneta SimmersBartle.        Current Outpatient Prescriptions on File Prior to Visit  Medication Sig Dispense Refill  . aspirin EC 325 MG EC tablet Take 1 tablet (325 mg total) by mouth daily.  30 tablet  0  . glyBURIDE-metformin (GLUCOVANCE) 5-500 MG per tablet Take 1 tablet by mouth 2 (two) times daily with a meal.      . metoprolol tartrate (LOPRESSOR) 25 MG tablet Take 0.5 tablets (12.5 mg total) by mouth 2 (two) times daily.  30 tablet  3  . phenytoin (DILANTIN) 100 MG ER capsule Take 300 mg by mouth at bedtime.      . simvastatin (ZOCOR) 40 MG tablet Take 40 mg by mouth daily.       No current facility-administered medications on file prior to visit.    No Known Allergies  Past Medical History  Diagnosis Date  . Diabetes mellitus without complication   . Seizure disorder   . Tobacco abuse   . Ischemic cardiomyopathy   . Acute thrombus of left ventricle   . Memory deficits   . Schizo affective schizophrenia   . Epilepsy     Past Surgical History  Procedure Laterality Date  . Coronary artery bypass graft N/A 04/10/2013    Procedure: CORONARY ARTERY BYPASS GRAFTING (CABG);  Surgeon: Alleen BorneBryan K  Bartle, MD;  [facility];  Service: Open Heart Surgery;  Laterality: N/A;  Times 4 using left internal mammary artery and endoscopically harvested right saphenous vein.  . Intraoperative transesophageal echocardiogram N/A 04/10/2013    Procedure: INTRAOPERATIVE TRANSESOPHAGEAL ECHOCARDIOGRAM;  Surgeon: Alleen BorneBryan K Bartle, MD;  [facility];  Service: Open Heart Surgery;  Laterality: N/A;    History  Smoking status  . Former Smoker -- 2.00 packs/day  . Types: Cigarettes  . Quit date: 04/19/2013  Smokeless tobacco  . Not on file    History  Alcohol Use: Not on file    No family history on file.  Reviw of Systems:  Reviewed in the HPI.  All other systems are negative.  Physical Exam: Blood pressure 90/70, pulse 70, height 5\' 3"  (1.6 m), weight 139 lb 6.4 oz (63.231 kg). Wt Readings from Last 3 Encounters:  07/03/13 139 lb 6.4 oz (63.231 kg)  05/06/13 131 lb (59.421 kg)  04/15/13 131 lb 8 oz (59.648 kg)     General:  She was not very engaged in the visit.  Seemed to understand what she has gone through but she showed very little emotion during the visit  Head: Normocephalic, atraumatic, sclera non-icteric, mucus membranes are moist,   Neck: Supple. Carotids are 2 + without bruits. No JVD  Lungs: Clear   Heart: RR, S1S2.  Sternotomy is healing well  Abdomen: Soft, non-tender, non-distended with normal bowel sounds.  Msk:  Strength and tone are normal   Extremities: No clubbing or cyanosis. No edema.  Distal pedal pulses are 2+ and equal    Neuro: CN II - XII intact.  Alert and oriented X 3.   Psych:  Normal   ECG:  Assessment / Plan:

## 2013-07-03 NOTE — Assessment & Plan Note (Signed)
Ejection fraction is around 25%. Her blood pressure is low and she is only on metoprolol.  She had very limited interaction during our office visit today. She would barely answer any questions. Her brother and her daughter answered almost all the questions.  We had long discussion regarding salt limitation. He also discussed low carbohydrate diet.  We also discussed smoking cessation. Does not appear that she is completely "bought into" the idea of living  a healthier life.

## 2013-07-03 NOTE — Patient Instructions (Addendum)
  Your physician wants you to follow-up in: 6 MONTHS WITH DR. Elease HashimotoNAHSER You will receive a reminder letter in the mail two months in advance. If you don't receive a letter, please call our office to schedule the follow-up appointment.  Your physician recommends that you return for lab work in: 6 MONTHS (LIPID, BMET, HFP)  Your physician has requested that you have an echocardiogram IN 5 MONTHS BEFORE APPOINTMENT WITH DR. Elease HashimotoNAHSER. Echocardiography is a painless test that uses sound waves to create images of your heart. It provides your doctor with information about the size and shape of your heart and how well your heart's chambers and valves are working. This procedure takes approximately one hour. There are no restrictions for this procedure.  Your physician recommends that you continue on your current medications as directed. Please refer to the Current Medication list given to you today.   [facility] Low Glycemic Diet (Source: [facility], 2006) Low Glycemic Foods (20-49) (Decrease risk of developing heart disease) Breakfast Cereals: All-Bran All-Bran Fruit 'n Oats Fiber One Oatmeal (not instant) Oat bran Fruits and fruit juices: (Limit to 1-2 servings per day) Apples Apricots (fresh & dried) Blackberries Blueberries Cherries Cranberries Peaches Pears Plums Prunes Grapefruit Raspberries Strawberries Tangerine Apple juice Grapefruit juice Tomato juice Beans and legumes (fresh-cooked): Black-eyed peas Butter beans Chick peas Lentils  Green beans Lima beans Kidney beans Navy beans Pinto beans Snow peas Non-starchy vegetables: Asparagus, avocado, broccoli, cabbage, cauliflower, celery, cucumber, greens, lettuce, mushrooms, peppers, tomatoes, okra, onions, spinach, summer squash Grains: Barley Bulgur Rye Wild rice Nuts and oils : Almonds Peanuts Sunflower seeds Hazelnuts Pecans Walnuts Oils that are liquid at room temperature Dairy, fish, meat, soy, and  eggs: Milk, skim Lowfat cheese Yogurt, lowfat, fruit sugar sweetened Lean red meat Fish  Skinless chicken & Malawiturkey Shellfish Egg whites (up to 3 daily) Soy products  Egg yolks (up to 7 or _____ per week) Moderate Glycemic Foods (50-69) Breakfast Cereals: Bran Buds Bran Chex Just Right Mini-Wheats  Special K Swiss muesli Fruits: Banana (under-ripe) Dates Figs Grapes Kiwi Mango Oranges Raisins Fruit Juices: Cranberry juice Orange juice Beans and legumes: Boston-type baked beans Canned pinto, kidney, or navy beans Green peas Vegetables: Beets Carrots  Sweet potato Yam Corn on the cob Breads: Pita (pocket) bread Oat bran bread Pumpernickel bread Rye bread Wheat bread, high fiber  Grains: Cornmeal Rice, brown Rice, white Couscous Pasta: Macaroni Pizza, cheese Ravioli, meat filled Spaghetti, white  Nuts: Cashews Macadamia Snacks: Chocolate Ice cream, lowfat Muffin Popcorn High Glycemic Foods (70-100)  Breakfast Cereals: Cheerios Corn Chex Corn Flakes Cream of Wheat Grape Nuts Grape Nut Flakes Grits Nutri-Grain Puffed Rice Puffed Wheat Rice Chex Rice Krispies Shredded Wheat Team Total Fruits: Pineapple Watermelon Banana (over-ripe) Beverages: Sodas, sweet tea, pineapple juice Vegetables: Potato, baked, boiled, fried, mashed JamaicaFrench fries Canned or frozen corn Parsnips Winter squash Breads: Most breads (white and whole grain) Bagels Bread sticks Bread stuffing Kaiser roll Dinner rolls Grains: Rice, instant Tapioca, with milk Candy and most cookies Snacks: Donuts Corn chips Jelly beans Pretzels Pastries

## 2013-08-05 ENCOUNTER — Other Ambulatory Visit: Payer: Self-pay | Admitting: Physician Assistant

## 2013-09-20 DIAGNOSIS — I259 Chronic ischemic heart disease, unspecified: Secondary | ICD-10-CM | POA: Insufficient documentation

## 2013-09-20 DIAGNOSIS — G40909 Epilepsy, unspecified, not intractable, without status epilepticus: Secondary | ICD-10-CM | POA: Insufficient documentation

## 2013-09-20 DIAGNOSIS — I240 Acute coronary thrombosis not resulting in myocardial infarction: Secondary | ICD-10-CM | POA: Insufficient documentation

## 2013-09-20 DIAGNOSIS — Z136 Encounter for screening for cardiovascular disorders: Secondary | ICD-10-CM | POA: Insufficient documentation

## 2013-10-06 ENCOUNTER — Other Ambulatory Visit: Payer: Self-pay | Admitting: Surgery

## 2014-04-29 ENCOUNTER — Encounter (HOSPITAL_COMMUNITY): Payer: Self-pay | Admitting: Internal Medicine

## 2014-07-15 IMAGING — CR DG CHEST 2V
2 series · 2 of 2 positions shown · non-contrast
Comparison: 04/13/2013

CLINICAL DATA: Chest pain patient with history of CABG x2 weeks

EXAM:
CHEST  2 VIEW

[w chest pa]
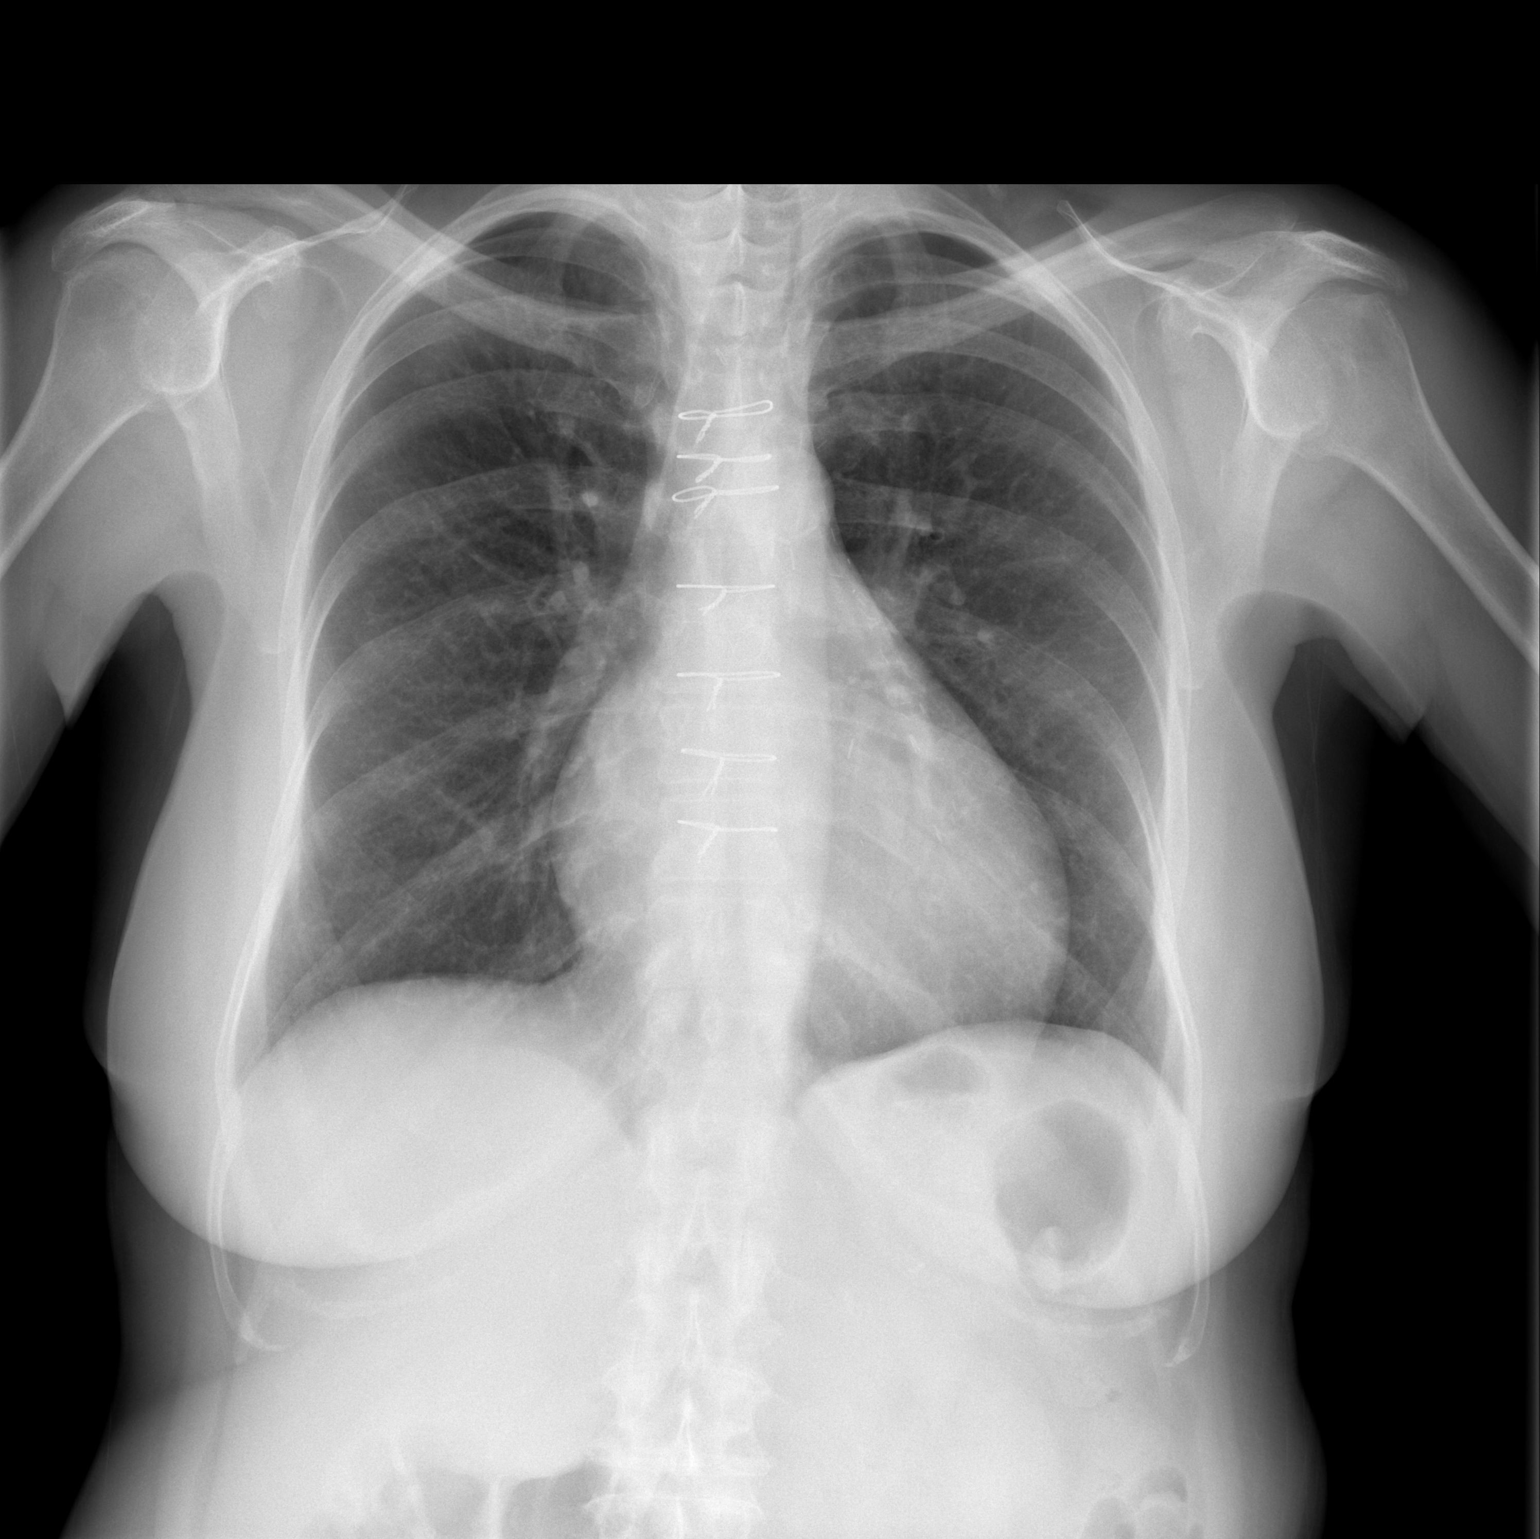

[w chest lat]
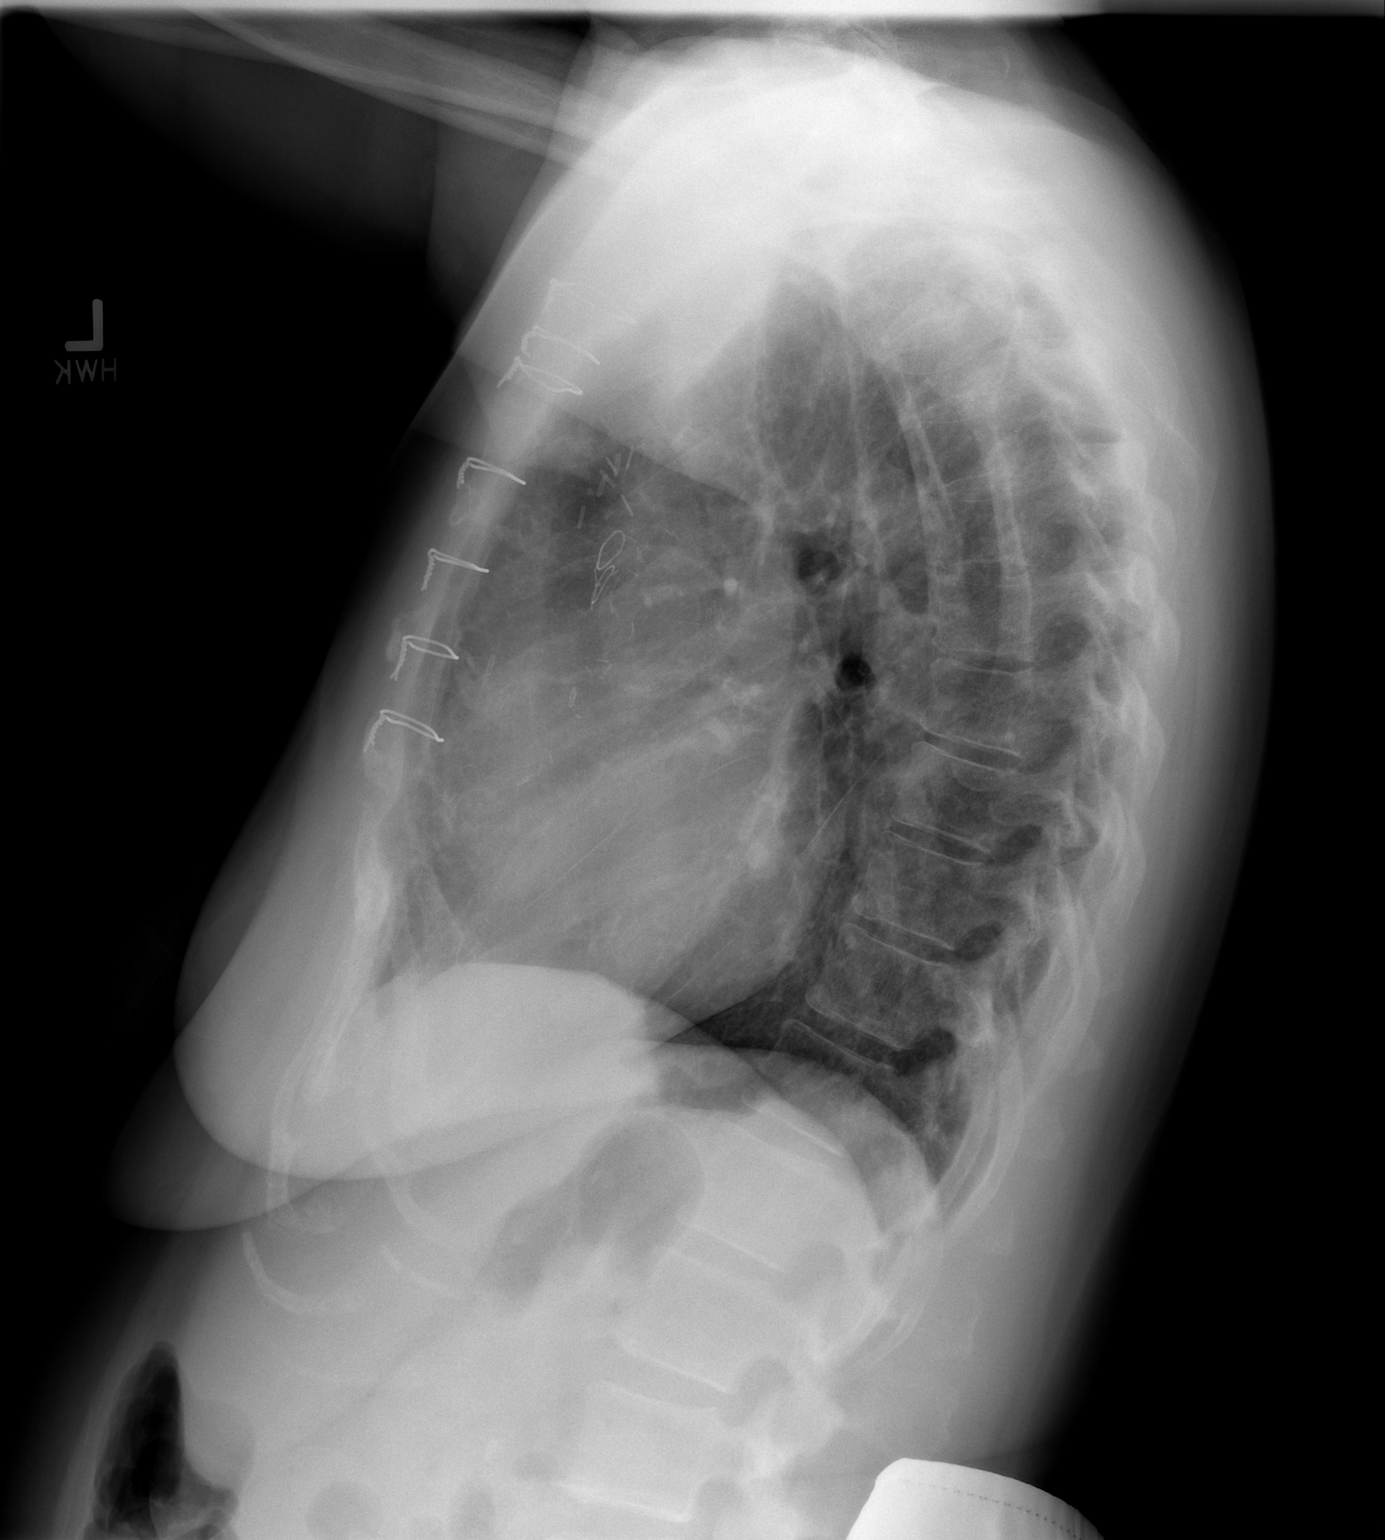

[2 of 2 positions shown; findings below may reference images not displayed]

FINDINGS: Patient is status post median sternotomy and coronary artery bypass
grafting. The cardiac silhouette is mildly enlarged. No focal reason
consolidation or focal infiltrates appreciated. The osseous
structures are unremarkable.
IMPRESSION: No evidence of acute cardiopulmonary disease.

## 2014-10-11 ENCOUNTER — Ambulatory Visit: Payer: Medicaid Other | Admitting: Neurology

## 2014-10-21 ENCOUNTER — Ambulatory Visit (INDEPENDENT_AMBULATORY_CARE_PROVIDER_SITE_OTHER): Payer: Medicaid Other | Admitting: Neurology

## 2014-10-21 ENCOUNTER — Encounter: Payer: Self-pay | Admitting: Neurology

## 2014-10-21 VITALS — BP 104/61 | HR 69 | Ht 63.0 in | Wt 130.0 lb

## 2014-10-21 DIAGNOSIS — R569 Unspecified convulsions: Secondary | ICD-10-CM | POA: Diagnosis not present

## 2014-10-21 MED ORDER — LAMOTRIGINE 100 MG PO TABS
100.0000 mg | ORAL_TABLET | Freq: Two times a day (BID) | ORAL | Status: DC
Start: 2014-10-21 — End: 2014-12-14

## 2014-10-21 MED ORDER — LAMOTRIGINE 25 MG PO TABS: ORAL_TABLET | ORAL | Status: DC

## 2014-10-21 NOTE — Patient Instructions (Signed)
Wean off Dilantin, start new prescription lamotrigine  First week:  Dilantin 100 mg tablet,  3 tablets every night, lamotrigine 25 mg 1 tablet twice a day  Second week: Dilantin 100 mg tablet,  2 tablets every night, lamotrigine 25 mg 2 tablet twice a day  Third Week:   Dilantin 100 mg tablet,  1 tablets every night, lamotrigine 25 mg 3 tablet twice a day  4th Week: Stop taking Dilantin,  Start lamotrigine 100mg  twice a day

## 2014-10-21 NOTE — Progress Notes (Signed)
PATIENT: [name] [name] DOB: 08/24/1950  Chief Complaint  Patient presents with  . Seizures    [name] [name] is here with her daughter. [name] [name], who is reporting her mother has 1-3 seizures each week.  Says she takes her medication as prescribed without missing doses.    HISTORICAL  [name] [name] is a 64 years old right-handed female, accompanied by her daughter [name] [name], referred by her primary care nurse practitioner [name] [name] for evaluation of seizure  She had a history of schizophrenia, hypertension, hyperlipidemia, type 2 diabetes, coronary artery disease, status post bypass surgery smoker  She started to have seizure around 1986, increased occurrence since 2000. She has average 1-3 seizure in one week, staring spells, drop her head, making noises, woke up confused, sometimes burn her face with cigarette stub each episode lasts less than 5 minutes  Over the past few years, she has been taking Dilantin 300 mg every night, extra 30 mg tablet every Monday, Wednesday, Friday.  She denies gait difficulty, watches TV all day long,  I have reviewed MRI cardiac morphology in Nov 2014, left ventricle has normal size, with severely decreased ejection fraction 20-25%, hypokinesia is in the mid anterior and anteroseptal walls. There is dyskinesis of the all apical four segments and in the true apex with a thrombus in the apex measuring 28 x 20 x 18 mm. There is a transmural scar in the apical septal and inferior walls and in the true apex. There is nontransmural scar in the apical anterior, mid antero and inferoseptal walls with good prognosis if revascularized. Right ventricle has normal size and function. There is mild mitral regurgitation and mild left atrial enlargement.  REVIEW OF SYSTEMS: Full 14 system review of systems performed and notable only for as above ALLERGIES: No Known Allergies  HOME MEDICATIONS: Current Outpatient Prescriptions  Medication Sig Dispense Refill  . aspirin 81  MG tablet Take 81 mg by mouth daily.    Marland Kitchen. atorvastatin (LIPITOR) 80 MG tablet Take 80 mg by mouth daily.    Marland Kitchen. glyBURIDE-metformin (GLUCOVANCE) 5-500 MG per tablet Take 1 tablet by mouth 2 (two) times daily with a meal.    . lisinopril (PRINIVIL,ZESTRIL) 5 MG tablet Take 5 mg by mouth daily.    Marland Kitchen. LORazepam (ATIVAN) 0.5 MG tablet Take 0.5 mg by mouth every 8 (eight) hours.    . metoprolol tartrate (LOPRESSOR) 25 MG tablet Take 0.5 tablets (12.5 mg total) by mouth 2 (two) times daily. 30 tablet 3  . nitroGLYCERIN (NITROSTAT) 0.4 MG SL tablet Place 0.4 mg under the tongue.    . phenytoin (DILANTIN) 100 MG ER capsule Take 300 mg by mouth at bedtime.    . phenytoin (DILANTIN) 30 MG ER capsule TAKE ONE EXTRA  CAPSULE BY MOUTH THREE TIMES A WEEK - ON MONDAY, WEDNESDAY AND FRIDAY.       PAST MEDICAL HISTORY: Past Medical History  Diagnosis Date  . Diabetes mellitus without complication   . Seizure disorder   . Tobacco abuse   . Ischemic cardiomyopathy   . Acute thrombus of left ventricle   . Memory deficits   . Schizo affective schizophrenia   . Epilepsy     PAST SURGICAL HISTORY: Past Surgical History  Procedure Laterality Date  . Coronary artery bypass graft N/A 04/10/2013    Procedure: CORONARY ARTERY BYPASS GRAFTING (CABG);  Surgeon: Alleen BorneBryan K Bartle, MD;  [facility];  Service: Open Heart Surgery;  Laterality: N/A;  Times 4 using left internal mammary artery  and endoscopically harvested right saphenous vein.  . Intraoperative transesophageal echocardiogram N/A 04/10/2013    Procedure: INTRAOPERATIVE TRANSESOPHAGEAL ECHOCARDIOGRAM;  Surgeon: Alleen Borne, MD;  Location: [facility] OR;  Service: Open Heart Surgery;  Laterality: N/A;  . Coronary angiogram  04/03/2013    Procedure: CORONARY ANGIOGRAM;  Surgeon: Dolores Patty, MD;  Location: [facility] CATH LAB;  Service: Cardiovascular;;  . Right heart catheterization  04/03/2013    Procedure: RIGHT HEART CATH;  Surgeon: Dolores Patty, MD;   Location: [facility] CATH LAB;  Service: Cardiovascular;;    FAMILY HISTORY: Family History  Problem Relation Age of Onset  . Seizures Brother   . Heart disease Sister   . Thyroid disease Mother   . Liver disease Father     SOCIAL HISTORY:  History   Social History  . Marital Status: Single    Spouse Name: N/A  . Number of Children: 3  . Years of Education: 11   Occupational History  . Unemployed    Social History Main Topics  . Smoking status: Current Every Day Smoker -- 2.00 packs/day    Types: Cigarettes    Last Attempt to Quit: 04/19/2013  . Smokeless tobacco: Not on file  . Alcohol Use: No  . Drug Use: No  . Sexual Activity: Not on file   Other Topics Concern  . Not on file   Social History Narrative   Lives at home alone.   Right-handed.   Approximately two 2 liters per day of soda.     PHYSICAL EXAM   Filed Vitals:   10/21/14 0922  BP: 104/61  Pulse: 69  Height:  (1.6 m)  Weight: 130 lb (58.968 kg)    Not recorded      Body mass index is 23.03 kg/(m^2).  PHYSICAL EXAMNIATION:  Gen: NAD, conversant, well nourised, obese, well groomed                     Cardiovascular: Regular rate rhythm, no peripheral edema, warm, nontender. Eyes: Conjunctivae clear without exudates or hemorrhage Neck: Supple, no carotid bruise. Pulmonary: Clear to auscultation bilaterally   NEUROLOGICAL EXAM:  MENTAL STATUS: Speech/ cognition: She depends on her daughter to provide history, depressed looking middle-age female, cooperative on neurological examinations,  CRANIAL NERVES: CN II: Visual fields are full to confrontation. Fundoscopic exam is normal with sharp discs and no vascular changes. Pupils were equal and briskly reactive to light.  CN III, IV, VI: extraocular movement are normal. No ptosis. CN V: Facial sensation is intact to pinprick in all 3 divisions bilaterally. Corneal responses are intact.  CN VII: Face is symmetric with normal eye closure and  smile. CN VIII: Hearing is normal to rubbing fingers CN IX, X: Palate elevates symmetrically. Phonation is normal. CN XI: Head turning and shoulder shrug are intact CN XII: Tongue is midline with normal movements and no atrophy.  MOTOR: There is no pronator drift of out-stretched arms. Muscle bulk and tone are normal. Muscle strength is normal.  REFLEXES: Reflexes are 2+ and symmetric at the biceps, triceps, knees, and ankles. Plantar responses are flexor.  SENSORY: Light touch, pinprick, position sense, and vibration sense are intact in fingers and toes.  COORDINATION: Rapid alternating movements and fine finger movements are intact. There is no dysmetria on finger-to-nose and heel-knee-shin. There are no abnormal or extraneous movements.   GAIT/STANCE: Posture is normal. Gait is steady with normal steps, base, arm swing, and turning. Heel and toe walking are  normal. Tandem gait is normal.  Romberg is absent.   DIAGNOSTIC DATA (LABS, IMAGING, TESTING) - I reviewed patient records, labs, notes, testing and imaging myself where available.  Lab Results  Component Value Date   WBC 6.9 04/14/2013   HGB 11.2* 04/14/2013   HCT 32.3* 04/14/2013   MCV 92.3 04/14/2013   PLT 218 04/14/2013      Component Value Date/Time   NA 138 04/14/2013 0540   K 3.4* 04/14/2013 0540   CL 98 04/14/2013 0540   CO2 24 04/14/2013 0540   GLUCOSE 87 04/14/2013 0540   BUN 7 04/14/2013 0540   CREATININE 0.43* 04/14/2013 0540   CALCIUM 9.0 04/14/2013 0540   PROT 6.8 04/10/2013 0500   ALBUMIN 3.4* 04/10/2013 0500   AST 30 04/10/2013 0500   ALT 26 04/10/2013 0500   ALKPHOS 136* 04/10/2013 0500   BILITOT 0.3 04/10/2013 0500   GFRNONAA >90 04/14/2013 0540   GFRAA >90 04/14/2013 0540   Lab Results  Component Value Date   CHOL 302* 04/03/2013   HDL 35* 04/03/2013   LDLCALC 207* 04/03/2013   TRIG 299* 04/03/2013   CHOLHDL 8.6 04/03/2013   Lab Results  Component Value Date   HGBA1C 11.9*  04/03/2013   No results found for: [id number] Lab Results  Component Value Date   TSH 0.614 04/03/2013      ASSESSMENT AND PLAN  Tabithia Stroder is a 64 y.o. female with long-standing history of complex partial seizure   1, complete evaluation with MRI of the brain with without contrast, EEG 2. Dilantin is the enzymes inducer, it is not a good choice with her polypharmacy, start lamotrigine, titrating to 100 mg twice a day 3. Return to clinic in one month  Levert Feinstein, M.D. Ph.D.  [facility] [address], [address] [place], [place] [address] Ph: [phone] Fax: [phone]

## 2014-10-22 LAB — COMPREHENSIVE METABOLIC PANEL
ALBUMIN: 4.3 g/dL (ref 3.6–4.8)
ALT: 28 IU/L (ref 0–32)
AST: 27 IU/L (ref 0–40)
Albumin/Globulin Ratio: 1.5 (ref 1.1–2.5)
Alkaline Phosphatase: 142 IU/L — ABNORMAL HIGH (ref 39–117)
BUN/Creatinine Ratio: 15 (ref 11–26)
BUN: 10 mg/dL (ref 8–27)
Bilirubin Total: 0.2 mg/dL (ref 0.0–1.2)
CALCIUM: 9.8 mg/dL (ref 8.7–10.3)
CHLORIDE: 103 mmol/L (ref 97–108)
CO2: 22 mmol/L (ref 18–29)
CREATININE: 0.66 mg/dL (ref 0.57–1.00)
GFR calc Af Amer: 110 mL/min/{1.73_m2} (ref >59)
GFR calc non Af Amer: 96 mL/min/{1.73_m2} (ref >59)
Globulin, Total: 2.8 g/dL (ref 1.5–4.5)
Glucose: 93 mg/dL (ref 65–99)
POTASSIUM: 4.6 mmol/L (ref 3.5–5.2)
SODIUM: 140 mmol/L (ref 134–144)
Total Protein: 7.1 g/dL (ref 6.0–8.5)

## 2014-10-22 LAB — PHENYTOIN LEVEL, TOTAL: Phenytoin Lvl: 11.7 ug/mL (ref 10.0–20.0)

## 2014-10-22 LAB — TSH: TSH: 0.583 u[IU]/mL (ref 0.450–4.500)

## 2014-10-26 ENCOUNTER — Telehealth: Payer: Self-pay | Admitting: Neurology

## 2014-10-26 ENCOUNTER — Telehealth: Payer: Self-pay | Admitting: *Deleted

## 2014-10-26 NOTE — Telephone Encounter (Signed)
Patient's daughter Darcus AustinYalanda is calling in regard to mother's medication.  She picked up the Rx Dilantin 100 mg.  She stated her mother has been taking an additional 30 Mg Dilantin on Monday, Wed, Friday.   Does she still need to take this.  Also she picked up Rx lamotrigine med.  The lamotrigine was in 100 mg dosage.  She needed the dosage to be 25 mg.  I read what you had on file to her.  Please call daughter to clarify. Thanks!

## 2014-10-26 NOTE — Telephone Encounter (Signed)
Patient's daughter([name]) called requesting clarification on directions for lamoTRIgine (LAMICTAL) 100 MG tablet . Please call and advise. She can be reached at [phone].

## 2014-10-26 NOTE — Telephone Encounter (Signed)
Patients daughter called back and wants to know if the patient should continue taking Rx. DILANTIN. Please call and advise.

## 2014-10-26 NOTE — Telephone Encounter (Signed)
-----   Message from Levert FeinsteinYijun Yan, MD sent at 10/26/2014  7:55 AM EDT ----- Please call patient for mild elevated alkaline phosphatase of unknown clinical significance, otherwise normal CMP, TSH, Dilantin level was 11.7

## 2014-10-26 NOTE — Telephone Encounter (Signed)
I called the pharmacy regarding the Lamictal 25mg  tab Rx that was sent.  Spoke with Judeth CornfieldStephanie who verified the 25mg  Rx is ready for pick up, but patient has not been in to get it yet.  I called daughter back.  She is aware and will pick up the Rx.  Said she understands the titration instructions as noted in the OV notes, and she would call us back if anything further is needed.

## 2014-10-26 NOTE — Telephone Encounter (Signed)
Last OV note says: Wean off Dilantin, start new prescription lamotrigine First week: Dilantin 100 mg tablet, 3 tablets every night, lamotrigine 25 mg 1 tablet twice a day  Second week: Dilantin 100 mg tablet, 2 tablets every night, lamotrigine 25 mg 2 tablet twice a day  Third Week: Dilantin 100 mg tablet, 1 tablets every night, lamotrigine 25 mg 3 tablet twice a day  4th Week:Stop taking Dilantin, Start lamotrigine 100mg  twice a day   I called back.  Got no answer.  Left message.

## 2014-10-26 NOTE — Telephone Encounter (Signed)
Patient notified of results.

## 2014-10-26 NOTE — Progress Notes (Signed)
Quick Note:  Please call patient for mild elevated alkaline phosphatase of unknown clinical significance, otherwise normal CMP, TSH, Dilantin level was 11.7 ______

## 2014-10-26 NOTE — Telephone Encounter (Signed)
Attempted call - again left message requesting a return call.  

## 2014-10-27 NOTE — Telephone Encounter (Signed)
Duplicate encounter - see other for notes.

## 2014-10-27 NOTE — Telephone Encounter (Signed)
Patient's daughter [name] returned your Michelle's call. Please call and advise. Patient can be reached at [phone].

## 2014-10-27 NOTE — Telephone Encounter (Signed)
Left another message for a return call

## 2014-10-27 NOTE — Telephone Encounter (Signed)
Spoke to [place] - went over dosing schedule for both Dilantin and lamotrigine - she verbalized understanding.

## 2014-11-01 ENCOUNTER — Other Ambulatory Visit: Payer: Self-pay | Admitting: Neurology

## 2014-11-02 ENCOUNTER — Other Ambulatory Visit: Payer: Medicaid Other

## 2014-11-03 ENCOUNTER — Inpatient Hospital Stay: Admission: RE | Admit: 2014-11-03 | Payer: Medicaid Other | Source: Ambulatory Visit

## 2014-11-30 ENCOUNTER — Ambulatory Visit: Payer: Medicaid Other | Admitting: Neurology

## 2014-12-14 ENCOUNTER — Ambulatory Visit (INDEPENDENT_AMBULATORY_CARE_PROVIDER_SITE_OTHER): Payer: Medicaid Other | Admitting: Neurology

## 2014-12-14 ENCOUNTER — Encounter: Payer: Self-pay | Admitting: Neurology

## 2014-12-14 VITALS — BP 105/60 | HR 58 | Ht 63.0 in | Wt 135.0 lb

## 2014-12-14 DIAGNOSIS — R569 Unspecified convulsions: Secondary | ICD-10-CM

## 2014-12-14 MED ORDER — LAMOTRIGINE ER 250 MG PO TB24: 250.0000 mg | ORAL_TABLET | Freq: Every day | ORAL | Status: DC

## 2014-12-14 NOTE — Progress Notes (Signed)
Chief Complaint  Patient presents with  . Seizures    She is here with her daughter, [name] [name].  While stopping Dilantin and titrating Lamictal to  twice daily, she had one seizure.  She has a MRI and EEG pending.      PATIENT: [name] [name] DOB: [birth year]  Chief Complaint  Patient presents with  . Seizures    She is here with her daughter, [name] [name].  While stopping Dilantin and titrating Lamictal to  twice daily, she had one seizure.  She has a MRI and EEG pending.    HISTORICAL  [name] [name] is a 64 years old right-handed female, accompanied by her daughter [name] [name], referred by her primary care nurse practitioner Jacqlyn Krauss for evaluation of seizure  She had a history of schizophrenia, hypertension, hyperlipidemia, type 2 diabetes, coronary artery disease, status post bypass surgery smoker  She started to have seizure around 1986, increased occurrence since 2000. She has average 1-3 seizure in one week, staring spells, drop her head, making noises, woke up confused, sometimes burn her face with cigarette stub, each episode lasts less than 5 minutes  Over the past few years, she has been taking Dilantin 300 mg every night, extra 30 mg tablet every Monday, Wednesday, Friday.  She denies gait difficulty, watches TV all day long,  I have reviewed MRI cardiac morphology in Nov 2014, left ventricle has normal size, with severely decreased ejection fraction 20-25%, hypokinesia is in the mid anterior and anteroseptal walls. There is dyskinesis of the all apical four segments and in the true apex with a thrombus in the apex measuring 28 x 20 x 18 mm. There is a transmural scar in the apical septal and inferior walls and in the true apex. There is nontransmural scar in the apical anterior, mid antero and inferoseptal walls with good prognosis if revascularized. Right ventricle has normal size and function.There is mild mitral regurgitation and mild left atrial enlargement.  UPDATE  July 26th 2016: She is with her daughter [name] [name] today, MRI brain, EEG pending. She had 4 seizures in one week during transition from Dilantin to lamotrigine, Lamotrigine  bid works well for her, no significant side effect from lamotrigine, she only has one seizure in July 24 while taking lamotrigine 100 mg twice a day, daughter concerned that compliance is the issue.  REVIEW OF SYSTEMS: Full 14 system review of systems performed and notable only for: As above  ALLERGIES: No Known Allergies  HOME MEDICATIONS: Current Outpatient Prescriptions  Medication Sig Dispense Refill  . aspirin 81 MG tablet Take 81 mg by mouth daily.    Marland Kitchen atorvastatin (LIPITOR) 80 MG tablet Take 80 mg by mouth daily.    Marland Kitchen glyBURIDE-metformin (GLUCOVANCE) 5-500 MG per tablet Take 1 tablet by mouth 2 (two) times daily with a meal.    . lisinopril (PRINIVIL,ZESTRIL) 5 MG tablet Take 5 mg by mouth daily.    Marland Kitchen LORazepam (ATIVAN) 0.5 MG tablet Take 0.5 mg by mouth every 8 (eight) hours.    . metoprolol tartrate (LOPRESSOR) 25 MG tablet Take 0.5 tablets (12.5 mg total) by mouth 2 (two) times daily. 30 tablet 3  . nitroGLYCERIN (NITROSTAT) 0.4 MG SL tablet Place 0.4 mg under the tongue.    . phenytoin (DILANTIN) 100 MG ER capsule Take 300 mg by mouth at bedtime.    . phenytoin (DILANTIN) 30 MG ER capsule TAKE ONE EXTRA  CAPSULE BY MOUTH THREE TIMES A WEEK - ON MONDAY, WEDNESDAY AND FRIDAY.  PAST MEDICAL HISTORY: Past Medical History  Diagnosis Date  . Diabetes mellitus without complication   . Seizure disorder   . Tobacco abuse   . Ischemic cardiomyopathy   . Acute thrombus of left ventricle   . Memory deficits   . Schizo affective schizophrenia   . Epilepsy     PAST SURGICAL HISTORY: Past Surgical History  Procedure Laterality Date  . Coronary artery bypass graft N/A 04/10/2013    Procedure: CORONARY ARTERY BYPASS GRAFTING (CABG);  Surgeon: Alleen Borne, MD;  Location: [facility] OR;  Service: Open Heart  Surgery;  Laterality: N/A;  Times 4 using left internal mammary artery and endoscopically harvested right saphenous vein.  . Intraoperative transesophageal echocardiogram N/A 04/10/2013    Procedure: INTRAOPERATIVE TRANSESOPHAGEAL ECHOCARDIOGRAM;  Surgeon: Alleen Borne, MD;  Location: [facility] OR;  Service: Open Heart Surgery;  Laterality: N/A;  . Coronary angiogram  04/03/2013    Procedure: CORONARY ANGIOGRAM;  Surgeon: Dolores Patty, MD;  Location: [facility] CATH LAB;  Service: Cardiovascular;;  . Right heart catheterization  04/03/2013    Procedure: RIGHT HEART CATH;  Surgeon: Dolores Patty, MD;  Location: [facility] CATH LAB;  Service: Cardiovascular;;    FAMILY HISTORY: Family History  Problem Relation Age of Onset  . Seizures Brother   . Heart disease Sister   . Thyroid disease Mother   . Liver disease Father     SOCIAL HISTORY:  History   Social History  . Marital Status: Single    Spouse Name: N/A  . Number of Children: 3  . Years of Education: 11   Occupational History  . Unemployed    Social History Main Topics  . Smoking status: Current Every Day Smoker -- 2.00 packs/day    Types: Cigarettes    Last Attempt to Quit: 04/19/2013  . Smokeless tobacco: Not on file  . Alcohol Use: No  . Drug Use: No  . Sexual Activity: Not on file   Other Topics Concern  . Not on file   Social History Narrative   Lives at home alone.   Right-handed.   Approximately two 2 liters per day of soda.     PHYSICAL EXAM   Filed Vitals:   12/14/14 0925  BP: 105/60  Pulse: 58  Height: 5\' 3"  (1.6 m)  Weight: 135 lb (61.236 kg)    Not recorded      Body mass index is 23.92 kg/(m^2).  PHYSICAL EXAMNIATION:  Gen: NAD, conversant, well nourised, obese, well groomed                     Cardiovascular: Regular rate rhythm, no peripheral edema, warm, nontender. Eyes: Conjunctivae clear without exudates or hemorrhage Neck: Supple, no carotid bruise. Pulmonary: Clear to auscultation  bilaterally   NEUROLOGICAL EXAM:  MENTAL STATUS: Speech/ cognition: She depends on her daughter to provide history, depressed looking middle-age female, cooperative on neurological examinations,  CRANIAL NERVES: CN II: Visual fields are full to confrontation. Fundoscopic exam is normal with sharp discs and no vascular changes. Pupils were equal and briskly reactive to light.  CN III, IV, VI: extraocular movement are normal. No ptosis. CN V: Facial sensation is intact to pinprick in all 3 divisions bilaterally. Corneal responses are intact.  CN VII: Face is symmetric with normal eye closure and smile. CN VIII: Hearing is normal to rubbing fingers CN IX, X: Palate elevates symmetrically. Phonation is normal. CN XI: Head turning and shoulder shrug are intact CN XII: Tongue  is midline with normal movements and no atrophy.  MOTOR: There is no pronator drift of out-stretched arms. Muscle bulk and tone are normal. Muscle strength is normal.  REFLEXES: Reflexes are 2+ and symmetric at the biceps, triceps, knees, and ankles. Plantar responses are flexor.  SENSORY: Light touch, pinprick, position sense, and vibration sense are intact in fingers and toes.  COORDINATION: Rapid alternating movements and fine finger movements are intact. There is no dysmetria on finger-to-nose and heel-knee-shin. There are no abnormal or extraneous movements.   GAIT/STANCE: Posture is normal. Gait is steady with normal steps, base, arm swing, and turning. Heel and toe walking are normal. Tandem gait is normal.  Romberg is absent.   DIAGNOSTIC DATA (LABS, IMAGING, TESTING) - I reviewed patient records, labs, notes, testing and imaging myself where available.  Lab Results  Component Value Date   WBC 6.9 04/14/2013   HGB 11.2* 04/14/2013   HCT 32.3* 04/14/2013   MCV 92.3 04/14/2013   PLT 218 04/14/2013      Component Value Date/Time   NA 140 10/21/2014 1009   NA 138 04/14/2013 0540   K 4.6 10/21/2014  1009   CL 103 10/21/2014 1009   CO2 22 10/21/2014 1009   GLUCOSE 93 10/21/2014 1009   GLUCOSE 87 04/14/2013 0540   BUN 10 10/21/2014 1009   BUN 7 04/14/2013 0540   CREATININE 0.66 10/21/2014 1009   CALCIUM 9.8 10/21/2014 1009   PROT 7.1 10/21/2014 1009   PROT 6.8 04/10/2013 0500   ALBUMIN 3.4* 04/10/2013 0500   AST 27 10/21/2014 1009   ALT 28 10/21/2014 1009   ALKPHOS 142* 10/21/2014 1009   BILITOT 0.2 10/21/2014 1009   BILITOT 0.3 04/10/2013 0500   GFRNONAA 96 10/21/2014 1009   GFRAA 110 10/21/2014 1009   Lab Results  Component Value Date   CHOL 302* 04/03/2013   HDL 35* 04/03/2013   LDLCALC 207* 04/03/2013   TRIG 299* 04/03/2013   CHOLHDL 8.6 04/03/2013   Lab Results  Component Value Date   HGBA1C 11.9* 04/03/2013   No results found for: [id number] Lab Results  Component Value Date   TSH 0.583 10/21/2014      ASSESSMENT AND PLAN  [name] [name] is a 64 y.o. female with long-standing history of complex partial seizure   1, complete evaluation with MRI of the brain with without contrast, EEG 2. her seizure is under better control with lamotrigine, compliance is an issue for her, I will change her to lamotrigine XR 250 mg every night  3. RTC in 3 months  Levert Feinstein, M.D. Ph.D.  [facility] [address], [address] [place], [place] [address] Ph: [phone] Fax: [phone]

## 2014-12-20 ENCOUNTER — Telehealth: Payer: Self-pay | Admitting: Neurology

## 2014-12-20 NOTE — Telephone Encounter (Signed)
Patient's daughter [name] is calling as she needs to get the procedure and diagnosis code for the EEG tomorrow to see if Medicaid covers.  Please call.  Thanks!

## 2014-12-20 NOTE — Telephone Encounter (Signed)
The diagnosis code is R56.9.  The procedure code will either be 95816 or 16109 (per Angie - depends on whether or not the patient falls asleep).

## 2014-12-21 ENCOUNTER — Telehealth: Payer: Self-pay | Admitting: Neurology

## 2014-12-21 ENCOUNTER — Inpatient Hospital Stay: Admission: RE | Admit: 2014-12-21 | Payer: Medicaid Other | Source: Ambulatory Visit

## 2014-12-21 ENCOUNTER — Other Ambulatory Visit: Payer: Medicaid Other

## 2014-12-21 NOTE — Telephone Encounter (Addendum)
Would like to proceed with peer-to-peer?  Yes, will proceed with peer to peer. Levert Feinstein, M.D. Ph.D.  [facility] [address] [place], [place] [address] Phone: [phone] Fax:      [phone]

## 2014-12-21 NOTE — Telephone Encounter (Signed)
Called for peer-to-peer - was told that this case has been overturned - approval number [id number] - effective from 12/16/14 to 01/15/15.  [facility] aware.

## 2014-12-21 NOTE — Telephone Encounter (Signed)
Pt's daughter called and needs to know the EEG procedure number to give to medicaid. She is trying to find out if the EEG will be covered. Please call and advise [phone]

## 2014-12-21 NOTE — Telephone Encounter (Signed)
Deanna Artis with [facility] is calling and states that the MRI service code [id number] has not been approved by Medicaid. She is requesting the doctor call Medicaid [phone] to try to get approval.  Thanks!

## 2014-12-21 NOTE — Telephone Encounter (Signed)
Angie has spoken to patient's daughter and let them know that Medicaid would cover the EEG.  Pt's daughter stated she would call back to reschedule her appt.

## 2014-12-29 ENCOUNTER — Ambulatory Visit
Admission: RE | Admit: 2014-12-29 | Discharge: 2014-12-29 | Disposition: A | Discharge status: Home / Self Care | Payer: Medicaid Other | Source: Ambulatory Visit | Attending: Neurology | Admitting: Neurology

## 2014-12-29 ENCOUNTER — Other Ambulatory Visit: Payer: Medicaid Other

## 2014-12-29 ENCOUNTER — Encounter (INDEPENDENT_AMBULATORY_CARE_PROVIDER_SITE_OTHER): Payer: Medicaid Other | Admitting: Diagnostic Neuroimaging

## 2014-12-29 DIAGNOSIS — R569 Unspecified convulsions: Secondary | ICD-10-CM

## 2014-12-29 MED ORDER — GADOBENATE DIMEGLUMINE 529 MG/ML IV SOLN
12.0000 mL | Freq: Once | INTRAVENOUS | Status: AC | PRN
  Administered 2014-12-29: 12 mL via INTRAVENOUS

## 2014-12-30 NOTE — Telephone Encounter (Signed)
Please call patient and her family, MRI of the brain showed age-related changes, no acute lesions.  Mildly abnormal MRI brain (with and without) demonstrating: 1. Mild scattered periventricular and subcortical foci of T2 hyperintensities. No abnormal lesions are seen on post contrast views. These findings are non-specific and considerations include autoimmune, inflammatory, post-infectious, microvascular ischemic or migraine associated etiologies. 2. No mesial temporal sclerosis or hippocampal atrophy. 3. No acute findings.

## 2014-12-31 NOTE — Telephone Encounter (Signed)
I have spoken with [name] [name] this morning and per Dr. Terrace Arabia, advised that [name] [name]'s mri brain showed age related changes but nothing acute.  She verbalized understanding of same/fim

## 2015-01-05 ENCOUNTER — Other Ambulatory Visit: Payer: Medicaid Other

## 2015-01-13 DIAGNOSIS — E782 Mixed hyperlipidemia: Secondary | ICD-10-CM | POA: Insufficient documentation

## 2015-01-13 DIAGNOSIS — I10 Essential (primary) hypertension: Secondary | ICD-10-CM | POA: Insufficient documentation

## 2015-01-27 ENCOUNTER — Ambulatory Visit (INDEPENDENT_AMBULATORY_CARE_PROVIDER_SITE_OTHER): Payer: Medicaid Other | Admitting: Neurology

## 2015-01-27 DIAGNOSIS — R569 Unspecified convulsions: Secondary | ICD-10-CM | POA: Diagnosis not present

## 2015-01-28 NOTE — Procedures (Signed)
   HISTORY: 64 years old female, with history of schizophrenic disorder, presented with seizure-like event.  TECHNIQUE:  16 channel EEG was performed based on standard 10-16 international system. One channel was dedicated to EKG, which has demonstrates normal sinus rhythm of 66 beats per minutes.  Upon awakening, the posterior background activity was well-developed, in alpha range 11 Hz, with amplitude of 35 microvoltage, reactive to eye opening and closure.  There was no evidence of epileptiform discharge.  Photic stimulation was not performed.  Hyperventilation was performed, there was no abnormality elicit.  Stage II sleep was achieved, as evident by K complexes, sleep spindles.  CONCLUSION: This is a  normal awake and asleep EEG.  There is no electrodiagnostic evidence of epileptiform discharge

## 2015-03-16 ENCOUNTER — Ambulatory Visit: Payer: Medicaid Other | Admitting: Neurology

## 2015-03-16 ENCOUNTER — Telehealth: Payer: Self-pay | Admitting: *Deleted

## 2015-03-16 NOTE — Telephone Encounter (Signed)
Daughter called to cancel her appt the morning she was scheduled - stated time conflicted with another appt.

## 2015-03-30 ENCOUNTER — Ambulatory Visit (INDEPENDENT_AMBULATORY_CARE_PROVIDER_SITE_OTHER): Payer: Medicaid Other | Admitting: Neurology

## 2015-03-30 ENCOUNTER — Encounter: Payer: Self-pay | Admitting: Neurology

## 2015-03-30 VITALS — BP 109/58 | HR 56 | Ht 63.0 in

## 2015-03-30 DIAGNOSIS — G40209 Localization-related (focal) (partial) symptomatic epilepsy and epileptic syndromes with complex partial seizures, not intractable, without status epilepticus: Secondary | ICD-10-CM | POA: Diagnosis not present

## 2015-03-30 MED ORDER — LAMOTRIGINE ER 300 MG PO TB24: 300.0000 mg | ORAL_TABLET | Freq: Every day | ORAL | Status: DC

## 2015-03-30 NOTE — Progress Notes (Signed)
Chief Complaint  Patient presents with  . Seizures    She is here with her daughter, [name].  They would like to review her MRI and EEG.  [name] feels the frequency of her seizures has reduced since transitioning to Lamictal XR.  However, compliance is still a problem at times.  She had a seizure on 03/20/15 due to missed medication.   Chief Complaint  Patient presents with  . Seizures    She is here with her daughter, [name].  They would like to review her MRI and EEG.  [name] feels the frequency of her seizures has reduced since transitioning to Lamictal XR.  However, compliance is still a problem at times.  She had a seizure on 03/20/15 due to missed medication.      PATIENT: Marcayla Budge DOB: 04-07-1951  Chief Complaint  Patient presents with  . Seizures    She is here with her daughter, [name].  They would like to review her MRI and EEG.  [name] feels the frequency of her seizures has reduced since transitioning to Lamictal XR.  However, compliance is still a problem at times.  She had a seizure on 03/20/15 due to missed medication.    HISTORICAL  Raelin Pixler is a 64 years old right-handed female, accompanied by her daughter [name], referred by her primary care nurse practitioner Jacqlyn Krauss for evaluation of seizure  She had a history of schizophrenia, hypertension, hyperlipidemia, type 2 diabetes, coronary artery disease, status post bypass surgery smoker  She started to have seizure around 1986, increased occurrence since 2000. She has average 1-3 seizure in one week, staring spells, drop her head, making noises, woke up confused, sometimes burn her face with cigarette stub, each episode lasts less than 5 minutes  Over the past few years, she has been taking Dilantin 300 mg every night, extra 30 mg tablet every Monday, Wednesday, Friday.  She denies gait difficulty, watches TV all day long,  I have reviewed MRI cardiac morphology in Nov 2014, left ventricle has  normal size, with severely decreased ejection fraction 20-25%, hypokinesia is in the mid anterior and anteroseptal walls. There is dyskinesis of the all apical four segments and in the true apex with a thrombus in the apex measuring 28 x 20 x 18 mm. There is a transmural scar in the apical septal and inferior walls and in the true apex. There is nontransmural scar in the apical anterior, mid antero and inferoseptal walls with good prognosis if revascularized. Right ventricle has normal size and function.There is mild mitral regurgitation and mild left atrial enlargement.  UPDATE July 26th 2016: She is with her daughter [name] today, MRI brain, EEG pending. She had 4 seizures in one week during transition from Dilantin to lamotrigine, Lamotrigine  bid works well for her, no significant side effect from lamotrigine, she only has one seizure in July 24 while taking lamotrigine 100 mg twice a day, daughter concerned that compliance is the issue  UPDATE Mar 30 2015: I have reviewed MRI of the brain with without contrast, no significant abnormality, EEG was normal.  She has no recurrent seizure, currently taking lamotrigine xr 300 mg every night   REVIEW OF SYSTEMS: Full 14 system review of systems performed and notable only for: Memory loss, seizure,   ALLERGIES: No Known Allergies  HOME MEDICATIONS: Current Outpatient Prescriptions  Medication Sig Dispense Refill  . aspirin 81 MG tablet Take 81 mg by mouth daily.    Marland Kitchen atorvastatin (LIPITOR) 80 MG tablet  Take 80 mg by mouth daily.    Marland Kitchen. glyBURIDE-metformin (GLUCOVANCE) 5-500 MG per tablet Take 1 tablet by mouth 2 (two) times daily with a meal.    . lisinopril (PRINIVIL,ZESTRIL) 5 MG tablet Take 5 mg by mouth daily.    Marland Kitchen. LORazepam (ATIVAN) 0.5 MG tablet Take 0.5 mg by mouth every 8 (eight) hours.    . metoprolol tartrate (LOPRESSOR) 25 MG tablet Take 0.5 tablets (12.5 mg total) by mouth 2 (two) times daily. 30 tablet 3  . nitroGLYCERIN  (NITROSTAT) 0.4 MG SL tablet Place 0.4 mg under the tongue.    . phenytoin (DILANTIN) 100 MG ER capsule Take 300 mg by mouth at bedtime.    . phenytoin (DILANTIN) 30 MG ER capsule TAKE ONE EXTRA  CAPSULE BY MOUTH THREE TIMES A WEEK - ON MONDAY, WEDNESDAY AND FRIDAY.       PAST MEDICAL HISTORY: Past Medical History  Diagnosis Date  . Diabetes mellitus without complication (HCC)   . Seizure disorder (HCC)   . Tobacco abuse   . Ischemic cardiomyopathy   . Acute thrombus of left ventricle (HCC)   . Memory deficits   . Schizo affective schizophrenia (HCC)   . Epilepsy (HCC)     PAST SURGICAL HISTORY: Past Surgical History  Procedure Laterality Date  . Coronary artery bypass graft N/A 04/10/2013    Procedure: CORONARY ARTERY BYPASS GRAFTING (CABG);  Surgeon: Alleen BorneBryan K Bartle, MD;  [facility];  Service: Open Heart Surgery;  Laterality: N/A;  Times 4 using left internal mammary artery and endoscopically harvested right saphenous vein.  . Intraoperative transesophageal echocardiogram N/A 04/10/2013    Procedure: INTRAOPERATIVE TRANSESOPHAGEAL ECHOCARDIOGRAM;  Surgeon: Alleen BorneBryan K Bartle, MD;  [facility];  Service: Open Heart Surgery;  Laterality: N/A;  . Coronary angiogram  04/03/2013    Procedure: CORONARY ANGIOGRAM;  Surgeon: Dolores Pattyaniel R Bensimhon, MD;  [facility];  Service: Cardiovascular;;  . Right heart catheterization  04/03/2013    Procedure: RIGHT HEART CATH;  Surgeon: Dolores Pattyaniel R Bensimhon, MD;  [facility];  Service: Cardiovascular;;    FAMILY HISTORY: Family History  Problem Relation Age of Onset  . Seizures Brother   . Heart disease Sister   . Thyroid disease Mother   . Liver disease Father     SOCIAL HISTORY:  Social History   Social History  . Marital Status: Single    Spouse Name: N/A  . Number of Children: 3  . Years of Education: 11   Occupational History  . Unemployed    Social History Main Topics  . Smoking status: Current Every Day  Smoker -- 2.00 packs/day    Types: Cigarettes    Last Attempt to Quit: 04/19/2013  . Smokeless tobacco: Not on file  . Alcohol Use: No  . Drug Use: No  . Sexual Activity: Not on file   Other Topics Concern  . Not on file   Social History Narrative   Lives at home alone.   Right-handed.   Approximately two 2 liters per day of soda.     PHYSICAL EXAM   Filed Vitals:   03/30/15 0931  BP: 109/58  Pulse: 56  Height: 5\' 3"  (1.6 m)    Not recorded      There is no weight on file to calculate BMI.  PHYSICAL EXAMNIATION:  Gen: NAD, conversant, well nourised, obese, well groomed  Cardiovascular: Regular rate rhythm, no peripheral edema, warm, nontender. Eyes: Conjunctivae clear without exudates or hemorrhage Neck: Supple, no carotid bruise. Pulmonary: Clear to auscultation bilaterally   NEUROLOGICAL EXAM:  MENTAL STATUS: Speech/ cognition: She depends on her daughter to provide history, depressed looking middle-age female, cooperative on neurological examinations,  CRANIAL NERVES: CN II: Visual fields are full to confrontation. Fundoscopic exam is normal with sharp discs and no vascular changes. Pupils were equal and briskly reactive to light.  CN III, IV, VI: extraocular movement are normal. No ptosis. CN V: Facial sensation is intact to pinprick in all 3 divisions bilaterally. Corneal responses are intact.  CN VII: Face is symmetric with normal eye closure and smile. CN VIII: Hearing is normal to rubbing fingers CN IX, X: Palate elevates symmetrically. Phonation is normal. CN XI: Head turning and shoulder shrug are intact CN XII: Tongue is midline with normal movements and no atrophy.  MOTOR: There is no pronator drift of out-stretched arms. Muscle bulk and tone are normal. Muscle strength is normal.  REFLEXES: Reflexes are 2+ and symmetric at the biceps, triceps, knees, and ankles. Plantar responses are flexor.  SENSORY: Light touch, pinprick,  position sense, and vibration sense are intact in fingers and toes.  COORDINATION: Rapid alternating movements and fine finger movements are intact. There is no dysmetria on finger-to-nose and heel-knee-shin. There are no abnormal or extraneous movements.   GAIT/STANCE: Posture is normal. Gait is steady with normal steps, base, arm swing, and turning. Heel and toe walking are normal. Tandem gait is normal.  Romberg is absent.   DIAGNOSTIC DATA (LABS, IMAGING, TESTING) - I reviewed patient records, labs, notes, testing and imaging myself where available.  Lab Results  Component Value Date   WBC 6.9 04/14/2013   HGB 11.2* 04/14/2013   HCT 32.3* 04/14/2013   MCV 92.3 04/14/2013   PLT 218 04/14/2013      Component Value Date/Time   NA 140 10/21/2014 1009   NA 138 04/14/2013 0540   K 4.6 10/21/2014 1009   CL 103 10/21/2014 1009   CO2 22 10/21/2014 1009   GLUCOSE 93 10/21/2014 1009   GLUCOSE 87 04/14/2013 0540   BUN 10 10/21/2014 1009   BUN 7 04/14/2013 0540   CREATININE 0.66 10/21/2014 1009   CALCIUM 9.8 10/21/2014 1009   PROT 7.1 10/21/2014 1009   PROT 6.8 04/10/2013 0500   ALBUMIN 4.3 10/21/2014 1009   ALBUMIN 3.4* 04/10/2013 0500   AST 27 10/21/2014 1009   ALT 28 10/21/2014 1009   ALKPHOS 142* 10/21/2014 1009   BILITOT 0.2 10/21/2014 1009   BILITOT 0.3 04/10/2013 0500   GFRNONAA 96 10/21/2014 1009   GFRAA 110 10/21/2014 1009   Lab Results  Component Value Date   CHOL 302* 04/03/2013   HDL 35* 04/03/2013   LDLCALC 207* 04/03/2013   TRIG 299* 04/03/2013   CHOLHDL 8.6 04/03/2013   Lab Results  Component Value Date   HGBA1C 11.9* 04/03/2013   No results found for: [id number] Lab Results  Component Value Date   TSH 0.583 10/21/2014      ASSESSMENT AND PLAN  Kyeshia Zinn is a 64 y.o. female   complex partial seizure   Keep lamotrigine xr 300 mg every night   Levert Feinstein, M.D. Ph.D.  [facility] [address] [place], [place] [address] Ph: [phone] Fax: [phone]

## 2015-04-06 DIAGNOSIS — E042 Nontoxic multinodular goiter: Secondary | ICD-10-CM | POA: Insufficient documentation

## 2015-05-05 DIAGNOSIS — E1151 Type 2 diabetes mellitus with diabetic peripheral angiopathy without gangrene: Secondary | ICD-10-CM | POA: Insufficient documentation

## 2015-07-13 DIAGNOSIS — Z951 Presence of aortocoronary bypass graft: Secondary | ICD-10-CM | POA: Insufficient documentation

## 2015-09-28 ENCOUNTER — Encounter: Payer: Self-pay | Admitting: Neurology

## 2015-09-28 ENCOUNTER — Ambulatory Visit (INDEPENDENT_AMBULATORY_CARE_PROVIDER_SITE_OTHER): Payer: Medicaid Other | Admitting: Neurology

## 2015-09-28 VITALS — BP 104/60 | HR 64 | Ht 63.0 in | Wt 141.8 lb

## 2015-09-28 DIAGNOSIS — G40209 Localization-related (focal) (partial) symptomatic epilepsy and epileptic syndromes with complex partial seizures, not intractable, without status epilepticus: Secondary | ICD-10-CM | POA: Diagnosis not present

## 2015-09-28 MED ORDER — LAMOTRIGINE ER 200 MG PO TB24: 400.0000 mg | ORAL_TABLET | Freq: Every day | ORAL | Status: DC

## 2015-09-28 NOTE — Progress Notes (Signed)
Chief Complaint  Patient presents with  . Seizures    She is here with her daughter, [name] [name].  Reports having two seizures since last seen in 03/2015.  Denies missing any doses of medications surrounding these events.      PATIENT: [name] [name] DOB: 30-Nov-1950  Chief Complaint  Patient presents with  . Seizures    She is here with her daughter, [name] [name].  Reports having two seizures since last seen in 03/2015.  Denies missing any doses of medications surrounding these events.    HISTORICAL  [name] [name] is a 65 years old right-handed female, accompanied by her daughter [name] [name], referred by her primary care nurse practitioner Jacqlyn Krauss for evaluation of seizure  She had a history of schizophrenia, hypertension, hyperlipidemia, type 2 diabetes, coronary artery disease, status post bypass surgery smoker  She started to have seizure around 1986, increased occurrence since 2000. She has average 1-3 seizure in one week, staring spells, drop her head, making noises, woke up confused, sometimes burn her face with cigarette stub, each episode lasts less than 5 minutes  Over the past few years, she has been taking Dilantin 300 mg every night, extra 30 mg tablet every Monday, Wednesday, Friday.  She denies gait difficulty, watches TV all day long,  I have reviewed MRI cardiac morphology in Nov 2014, left ventricle has normal size, with severely decreased ejection fraction 20-25%, hypokinesia is in the mid anterior and anteroseptal walls. There is dyskinesis of the all apical four segments and in the true apex with a thrombus in the apex measuring 28 x 20 x 18 mm. There is a transmural scar in the apical septal and inferior walls and in the true apex. There is nontransmural scar in the apical anterior, mid antero and inferoseptal walls with good prognosis if revascularized. Right ventricle has normal size and function.There is mild mitral regurgitation and mild left atrial  enlargement.  UPDATE July 26th 2016: She is with her daughter [name] [name] today, MRI brain, EEG pending. She had 4 seizures in one week during transition from Dilantin to lamotrigine, Lamotrigine 100mg  bid works well for her, no significant side effect from lamotrigine, she only has one seizure in July 24 while taking lamotrigine 100 mg twice a day, daughter concerned that compliance is the issue  UPDATE Mar 30 2015: I have reviewed MRI of the brain with without contrast, no significant abnormality, EEG was normal.  She has no recurrent seizure, currently taking lamotrigine xr 300 mg every night   UPDATE May 10th 2017: She is with her daughter at today's clinical visit, She is taking lamotrigine xr 300mg  qhs, has been compliant with her medication, she had two more seizure, in April 26th, and early April,    REVIEW OF SYSTEMS: Full 14 system review of systems performed and notable only for: Memory loss, seizure,   ALLERGIES: No Known Allergies  HOME MEDICATIONS: Current Outpatient Prescriptions  Medication Sig Dispense Refill  . aspirin 81 MG tablet Take 81 mg by mouth daily.    Marland Kitchen atorvastatin (LIPITOR) 80 MG tablet Take 80 mg by mouth daily.    Marland Kitchen glyBURIDE-metformin (GLUCOVANCE) 5-500 MG per tablet Take 1 tablet by mouth 2 (two) times daily with a meal.    . lisinopril (PRINIVIL,ZESTRIL) 5 MG tablet Take 5 mg by mouth daily.    Marland Kitchen LORazepam (ATIVAN) 0.5 MG tablet Take 0.5 mg by mouth every 8 (eight) hours.    . metoprolol tartrate (LOPRESSOR) 25 MG tablet Take 0.5 tablets (12.5 mg  total) by mouth 2 (two) times daily. 30 tablet 3  . nitroGLYCERIN (NITROSTAT) 0.4 MG SL tablet Place 0.4 mg under the tongue.    . phenytoin (DILANTIN) 100 MG ER capsule Take 300 mg by mouth at bedtime.    . phenytoin (DILANTIN) 30 MG ER capsule TAKE ONE EXTRA  CAPSULE BY MOUTH THREE TIMES A WEEK - ON MONDAY, WEDNESDAY AND FRIDAY.       PAST MEDICAL HISTORY: Past Medical History  Diagnosis Date  . Diabetes  mellitus without complication (HCC)   . Seizure disorder (HCC)   . Tobacco abuse   . Ischemic cardiomyopathy   . Acute thrombus of left ventricle (HCC)   . Memory deficits   . Schizo affective schizophrenia (HCC)   . Epilepsy (HCC)     PAST SURGICAL HISTORY: Past Surgical History  Procedure Laterality Date  . Coronary artery bypass graft N/A 04/10/2013    Procedure: CORONARY ARTERY BYPASS GRAFTING (CABG);  Surgeon: Alleen Borne, MD;  Location[facility];  Service: Open Heart Surgery;  Laterality: N/A;  Times 4 using left internal mammary artery and endoscopically harvested right saphenous vein.  . Intraoperative transesophageal echocardiogram N/A 04/10/2013    Procedure: INTRAOPERATIVE TRANSESOPHAGEAL ECHOCARDIOGRAM;  Surgeon: Alleen Borne, MD;  [facility];  Service: Open Heart Surgery;  Laterality: N/A;  . Coronary angiogram  04/03/2013    Procedure: CORONARY ANGIOGRAM;  Surgeon: Dolores Patty, MD;  [facility];  Service: Cardiovascular;;  . Right heart catheterization  04/03/2013    Procedure: RIGHT HEART CATH;  Surgeon: Dolores Patty, MD;  Location: [facility] CATH LAB;  Service: Cardiovascular;;    FAMILY HISTORY: Family History  Problem Relation Age of Onset  . Seizures Brother   . Heart disease Sister   . Thyroid disease Mother   . Liver disease Father     SOCIAL HISTORY:  Social History   Social History  . Marital Status: Single    Spouse Name: N/A  . Number of Children: 3  . Years of Education: 11   Occupational History  . Unemployed    Social History Main Topics  . Smoking status: Current Every Day Smoker -- 2.00 packs/day    Types: Cigarettes    Last Attempt to Quit: 04/19/2013  . Smokeless tobacco: Not on file  . Alcohol Use: No  . Drug Use: No  . Sexual Activity: Not on file   Other Topics Concern  . Not on file   Social History Narrative   Lives at home alone.   Right-handed.   Approximately two 2 liters per day of soda.      PHYSICAL EXAM   Filed Vitals:   09/28/15 1110  BP: 104/60  Pulse: 64  Height:  (1.6 m)  Weight: 141 lb 12 oz (64.297 kg)    Not recorded      Body mass index is 25.12 kg/(m^2).  PHYSICAL EXAMNIATION:  Gen: NAD, conversant, well nourised, obese, well groomed                     Cardiovascular: Regular rate rhythm, no peripheral edema, warm, nontender. Eyes: Conjunctivae clear without exudates or hemorrhage Neck: Supple, no carotid bruise. Pulmonary: Clear to auscultation bilaterally   NEUROLOGICAL EXAM:  MENTAL STATUS: Speech/ cognition: She depends on her daughter to provide history, depressed looking middle-age female, cooperative on neurological examinations,  CRANIAL NERVES: CN II: Visual fields are full to confrontation. Fundoscopic exam is normal with sharp  discs and no vascular changes. Pupils were equal and briskly reactive to light.  CN III, IV, VI: extraocular movement are normal. No ptosis. CN V: Facial sensation is intact to pinprick in all 3 divisions bilaterally. Corneal responses are intact.  CN VII: Face is symmetric with normal eye closure and smile. CN VIII: Hearing is normal to rubbing fingers CN IX, X: Palate elevates symmetrically. Phonation is normal. CN XI: Head turning and shoulder shrug are intact CN XII: Tongue is midline with normal movements and no atrophy.  MOTOR: There is no pronator drift of out-stretched arms. Muscle bulk and tone are normal. Muscle strength is normal.  REFLEXES: Reflexes are 2+ and symmetric at the biceps, triceps, knees, and ankles. Plantar responses are flexor.  SENSORY: Light touch, pinprick, position sense, and vibration sense are intact in fingers and toes.  COORDINATION: Rapid alternating movements and fine finger movements are intact. There is no dysmetria on finger-to-nose and heel-knee-shin. There are no abnormal or extraneous movements.   GAIT/STANCE: Posture is normal. Gait is steady with  normal steps, base, arm swing, and turning. Heel and toe walking are normal. Tandem gait is normal.  Romberg is absent.   DIAGNOSTIC DATA (LABS, IMAGING, TESTING) - I reviewed patient records, labs, notes, testing and imaging myself where available.  Lab Results  Component Value Date   WBC 6.9 04/14/2013   HGB 11.2* 04/14/2013   HCT 32.3* 04/14/2013   MCV 92.3 04/14/2013   PLT 218 04/14/2013      Component Value Date/Time   NA 140 10/21/2014 1009   NA 138 04/14/2013 0540   K 4.6 10/21/2014 1009   CL 103 10/21/2014 1009   CO2 22 10/21/2014 1009   GLUCOSE 93 10/21/2014 1009   GLUCOSE 87 04/14/2013 0540   BUN 10 10/21/2014 1009   BUN 7 04/14/2013 0540   CREATININE 0.66 10/21/2014 1009   CALCIUM 9.8 10/21/2014 1009   PROT 7.1 10/21/2014 1009   PROT 6.8 04/10/2013 0500   ALBUMIN 4.3 10/21/2014 1009   ALBUMIN 3.4* 04/10/2013 0500   AST 27 10/21/2014 1009   ALT 28 10/21/2014 1009   ALKPHOS 142* 10/21/2014 1009   BILITOT 0.2 10/21/2014 1009   BILITOT 0.3 04/10/2013 0500   GFRNONAA 96 10/21/2014 1009   GFRAA 110 10/21/2014 1009   Lab Results  Component Value Date   CHOL 302* 04/03/2013   HDL 35* 04/03/2013   LDLCALC 207* 04/03/2013   TRIG 299* 04/03/2013   CHOLHDL 8.6 04/03/2013   Lab Results  Component Value Date   HGBA1C 11.9* 04/03/2013   No results found for: [id number] Lab Results  Component Value Date   TSH 0.583 10/21/2014      ASSESSMENT AND PLAN  [name] [name] is a 65 y.o. female   Recurrent complex partial seizure, most recent on was in April 26th 2017  Increase lamotrigine xr from 300 mg to 2 tabs of 200mg  every night  Check lamotrigine level   Levert FeinsteinYijun Piedad Standiford, M.D. Ph.D.  [facility] [address], [address] [place], [place] [address] Ph: [phone] Fax: [phone]

## 2015-09-30 LAB — LAMOTRIGINE LEVEL: Lamotrigine Lvl: 9.3 ug/mL (ref 2.0–20.0)

## 2016-01-30 DIAGNOSIS — E049 Nontoxic goiter, unspecified: Secondary | ICD-10-CM | POA: Insufficient documentation

## 2016-02-21 HISTORY — PX: THYROIDECTOMY: SHX17

## 2016-04-04 ENCOUNTER — Ambulatory Visit (INDEPENDENT_AMBULATORY_CARE_PROVIDER_SITE_OTHER): Payer: Medicaid Other | Admitting: Neurology

## 2016-04-04 ENCOUNTER — Encounter: Payer: Self-pay | Admitting: Neurology

## 2016-04-04 ENCOUNTER — Ambulatory Visit: Payer: Medicaid Other | Admitting: Neurology

## 2016-04-04 VITALS — BP 92/52 | HR 67 | Ht 63.0 in | Wt 134.5 lb

## 2016-04-04 DIAGNOSIS — G40209 Localization-related (focal) (partial) symptomatic epilepsy and epileptic syndromes with complex partial seizures, not intractable, without status epilepticus: Secondary | ICD-10-CM | POA: Diagnosis not present

## 2016-04-04 DIAGNOSIS — F209 Schizophrenia, unspecified: Secondary | ICD-10-CM | POA: Diagnosis not present

## 2016-04-04 DIAGNOSIS — E119 Type 2 diabetes mellitus without complications: Secondary | ICD-10-CM

## 2016-04-04 MED ORDER — DIVALPROEX SODIUM ER 250 MG PO TB24: 500.0000 mg | ORAL_TABLET | Freq: Every day | ORAL | 11 refills | Status: DC

## 2016-04-04 NOTE — Progress Notes (Signed)
Chief Complaint  Patient presents with  . Seizures    She is here with her daughter, [name] [name]. She had a thyroidectomy on 02/21/16.  Reports having two seizures after her surgery.        PATIENT: [name] [name] DOB: 1950/07/27  Chief Complaint  Patient presents with  . Seizures    She is here with her daughter, [name] [name]. She had a thyroidectomy on 02/21/16.  Reports having two seizures after her surgery.      HISTORICAL  [name] [name] is a 65 years old right-handed female, accompanied by her daughter [name] [name], referred by her primary care nurse practitioner Jacqlyn Krauss for evaluation of seizure  She had a history of schizophrenia, hypertension, hyperlipidemia, type 2 diabetes, coronary artery disease, status post bypass surgery smoker  She started to have seizure around 1986, increased occurrence since 2000. She has average 1-3 seizure in one week, staring spells, drop her head, making noises, woke up confused, sometimes burn her face with cigarette stub, each episode lasts less than 5 minutes  Over the past few years, she has been taking Dilantin 300 mg every night, extra 30 mg tablet every Monday, Wednesday, Friday.  She denies gait difficulty, watches TV all day long,  I have reviewed MRI cardiac morphology in Nov 2014, left ventricle has normal size, with severely decreased ejection fraction 20-25%, hypokinesia is in the mid anterior and anteroseptal walls. There is dyskinesis of the all apical four segments and in the true apex with a thrombus in the apex measuring 28 x 20 x 18 mm. There is a transmural scar in the apical septal and inferior walls and in the true apex. There is nontransmural scar in the apical anterior, mid antero and inferoseptal walls with good prognosis if revascularized. Right ventricle has normal size and function.There is mild mitral regurgitation and mild left atrial enlargement.  UPDATE July 26th 2016: She is with her daughter [name] [name] today, MRI brain, EEG  pending. She had 4 seizures in one week during transition from Dilantin to lamotrigine, Lamotrigine 100mg  bid works well for her, no significant side effect from lamotrigine, she only has one seizure in July 24 while taking lamotrigine 100 mg twice a day, daughter concerned that compliance is the issue  UPDATE Mar 30 2015: I have reviewed MRI of the brain with without contrast, no significant abnormality, EEG was normal.  She has no recurrent seizure, currently taking lamotrigine xr 300 mg every night   UPDATE May 10th 2017: She is with her daughter at today's clinical visit, She is taking lamotrigine xr 300mg  qhs, has been compliant with her medication, she had two more seizure, in April 26th, and early April,    UPDATE Nov 15th 2017: She had 3 recurrent seizure, she had her thyroidectomy on Feb 21 2016, is on lamotrigine xr 200 mg 2 tablets every night, has been complying with her medications, level in May was 9.3,   Daughter reported that her seizure is under significant control, but she still had right hand scar from burning when she had a seizure few weeks ago,   REVIEW OF SYSTEMS: Full 14 system review of systems performed and notable only for: Memory loss, seizure,   ALLERGIES: No Known Allergies  HOME MEDICATIONS: Current Outpatient Prescriptions  Medication Sig Dispense Refill  . aspirin 81 MG tablet Take 81 mg by mouth daily.    Marland Kitchen atorvastatin (LIPITOR) 80 MG tablet Take 80 mg by mouth daily.    Marland Kitchen glyBURIDE-metformin (GLUCOVANCE) 5-500 MG per tablet  Take 1 tablet by mouth 2 (two) times daily with a meal.    . lisinopril (PRINIVIL,ZESTRIL) 5 MG tablet Take 5 mg by mouth daily.    Marland Kitchen. LORazepam (ATIVAN) 0.5 MG tablet Take 0.5 mg by mouth every 8 (eight) hours.    . metoprolol tartrate (LOPRESSOR) 25 MG tablet Take 0.5 tablets (12.5 mg total) by mouth 2 (two) times daily. 30 tablet 3  . nitroGLYCERIN (NITROSTAT) 0.4 MG SL tablet Place 0.4 mg under the tongue.    . phenytoin  (DILANTIN) 100 MG ER capsule Take 300 mg by mouth at bedtime.    . phenytoin (DILANTIN) 30 MG ER capsule TAKE ONE EXTRA  CAPSULE BY MOUTH THREE TIMES A WEEK - ON MONDAY, WEDNESDAY AND FRIDAY.       PAST MEDICAL HISTORY: Past Medical History:  Diagnosis Date  . Acute thrombus of left ventricle (HCC)   . Diabetes mellitus without complication (HCC)   . Epilepsy (HCC)   . Ischemic cardiomyopathy   . Memory deficits   . Schizo affective schizophrenia (HCC)   . Seizure disorder (HCC)   . Tobacco abuse     PAST SURGICAL HISTORY: Past Surgical History:  Procedure Laterality Date  . CORONARY ANGIOGRAM  04/03/2013   Procedure: CORONARY ANGIOGRAM;  Surgeon: Dolores Pattyaniel R Bensimhon, MD;  [facility];  Service: Cardiovascular;;  . CORONARY ARTERY BYPASS GRAFT N/A 04/10/2013   Procedure: CORONARY ARTERY BYPASS GRAFTING (CABG);  Surgeon: Alleen BorneBryan K Bartle, MD;  [facility];  Service: Open Heart Surgery;  Laterality: N/A;  Times 4 using left internal mammary artery and endoscopically harvested right saphenous vein.  . INTRAOPERATIVE TRANSESOPHAGEAL ECHOCARDIOGRAM N/A 04/10/2013   Procedure: INTRAOPERATIVE TRANSESOPHAGEAL ECHOCARDIOGRAM;  Surgeon: Alleen BorneBryan K Bartle, MD;  [facility];  Service: Open Heart Surgery;  Laterality: N/A;  . RIGHT HEART CATHETERIZATION  04/03/2013   Procedure: RIGHT HEART CATH;  Surgeon: Dolores Pattyaniel R Bensimhon, MD;  [facility];  Service: Cardiovascular;;  . THYROIDECTOMY  02/21/2016    FAMILY HISTORY: Family History  Problem Relation Age of Onset  . Seizures Brother   . Heart disease Sister   . Thyroid disease Mother   . Liver disease Father     SOCIAL HISTORY:  Social History   Social History  . Marital status: Single    Spouse name: N/A  . Number of children: 3  . Years of education: 1211   Occupational History  . Unemployed    Social History Main Topics  . Smoking status: Current Every Day Smoker    Packs/day: 2.00    Types: Cigarettes     Last attempt to quit: 04/19/2013  . Smokeless tobacco: Not on file  . Alcohol use No  . Drug use: No  . Sexual activity: Not on file   Other Topics Concern  . Not on file   Social History Narrative   Lives at home alone.   Right-handed.   Approximately two 2 liters per day of soda.     PHYSICAL EXAM   Vitals:   04/04/16 1202  BP: (!) 92/52  Pulse: 67  Weight: 134 lb 8 oz (61 kg)  Height: 5\' 3"  (1.6 m)    Not recorded      Body mass index is 23.83 kg/m.  PHYSICAL EXAMNIATION:  Gen: NAD, conversant, well nourised, obese, well groomed                     Cardiovascular: Regular rate rhythm, no  peripheral edema, warm, nontender. Eyes: Conjunctivae clear without exudates or hemorrhage Neck: Supple, no carotid bruise. Pulmonary: Clear to auscultation bilaterally   NEUROLOGICAL EXAM:  MENTAL STATUS: Speech/ cognition: She depends on her daughter to provide history, depressed looking middle-age female, cooperative on neurological examinations,  CRANIAL NERVES: CN II: Visual fields are full to confrontation. Fundoscopic exam is normal with sharp discs and no vascular changes. Pupils were equal and briskly reactive to light.  CN III, IV, VI: extraocular movement are normal. No ptosis. CN V: Facial sensation is intact to pinprick in all 3 divisions bilaterally. Corneal responses are intact.  CN VII: Face is symmetric with normal eye closure and smile. CN VIII: Hearing is normal to rubbing fingers CN IX, X: Palate elevates symmetrically. Phonation is normal. CN XI: Head turning and shoulder shrug are intact CN XII: Tongue is midline with normal movements and no atrophy.  MOTOR: There is no pronator drift of out-stretched arms. Muscle bulk and tone are normal. Muscle strength is normal.  REFLEXES: Reflexes are 2+ and symmetric at the biceps, triceps, knees, and ankles. Plantar responses are flexor.  SENSORY: Light touch, pinprick, position sense, and vibration  sense are intact in fingers and toes.  COORDINATION: Rapid alternating movements and fine finger movements are intact. There is no dysmetria on finger-to-nose and heel-knee-shin. There are no abnormal or extraneous movements.   GAIT/STANCE: Posture is normal. Gait is steady with normal steps, base, arm swing, and turning. Heel and toe walking are normal. Tandem gait is normal.  Romberg is absent.   DIAGNOSTIC DATA (LABS, IMAGING, TESTING) - I reviewed patient records, labs, notes, testing and imaging myself where available.  Lab Results  Component Value Date   WBC 6.9 04/14/2013   HGB 11.2 (L) 04/14/2013   HCT 32.3 (L) 04/14/2013   MCV 92.3 04/14/2013   PLT 218 04/14/2013      Component Value Date/Time   NA 140 10/21/2014 1009   K 4.6 10/21/2014 1009   CL 103 10/21/2014 1009   CO2 22 10/21/2014 1009   GLUCOSE 93 10/21/2014 1009   GLUCOSE 87 04/14/2013 0540   BUN 10 10/21/2014 1009   CREATININE 0.66 10/21/2014 1009   CALCIUM 9.8 10/21/2014 1009   PROT 7.1 10/21/2014 1009   ALBUMIN 4.3 10/21/2014 1009   AST 27 10/21/2014 1009   ALT 28 10/21/2014 1009   ALKPHOS 142 (H) 10/21/2014 1009   BILITOT 0.2 10/21/2014 1009   GFRNONAA 96 10/21/2014 1009   GFRAA 110 10/21/2014 1009   Lab Results  Component Value Date   CHOL 302 (H) 04/03/2013   HDL 35 (L) 04/03/2013   LDLCALC 207 (H) 04/03/2013   TRIG 299 (H) 04/03/2013   CHOLHDL 8.6 04/03/2013   Lab Results  Component Value Date   HGBA1C 11.9 (H) 04/03/2013   No results found for: [id number] Lab Results  Component Value Date   TSH 0.583 10/21/2014      ASSESSMENT AND PLAN  Marcello MooresBetty Manfredi is a 65 y.o. female   Recurrent complex partial seizure, most recent on was in November 2017  Continue lamotrigine xr 2 tabs of 200mg  every night  Add on Depakote ER titrating to 500 mg every night History of schizophrenia, on polypharmacy treatment  Levert FeinsteinYijun Jorgia Manthei, M.D. Ph.D.  [facility] [address], [address] [place], [place] [address] Ph: [phone] Fax: [phone]

## 2016-07-16 ENCOUNTER — Observation Stay (HOSPITAL_COMMUNITY)
Admission: EM | Admit: 2016-07-16 | Discharge: 2016-07-17 | Disposition: A | Discharge status: Home / Self Care | Payer: Medicaid Other | Attending: Family Medicine | Admitting: Family Medicine

## 2016-07-16 ENCOUNTER — Encounter (HOSPITAL_COMMUNITY): Payer: Self-pay

## 2016-07-16 ENCOUNTER — Emergency Department (HOSPITAL_COMMUNITY): Payer: Medicaid Other

## 2016-07-16 DIAGNOSIS — I5022 Chronic systolic (congestive) heart failure: Secondary | ICD-10-CM | POA: Diagnosis not present

## 2016-07-16 DIAGNOSIS — Z7982 Long term (current) use of aspirin: Secondary | ICD-10-CM | POA: Diagnosis not present

## 2016-07-16 DIAGNOSIS — F17210 Nicotine dependence, cigarettes, uncomplicated: Secondary | ICD-10-CM | POA: Diagnosis not present

## 2016-07-16 DIAGNOSIS — Z951 Presence of aortocoronary bypass graft: Secondary | ICD-10-CM | POA: Diagnosis not present

## 2016-07-16 DIAGNOSIS — F209 Schizophrenia, unspecified: Secondary | ICD-10-CM

## 2016-07-16 DIAGNOSIS — R079 Chest pain, unspecified: Secondary | ICD-10-CM | POA: Diagnosis present

## 2016-07-16 DIAGNOSIS — R0602 Shortness of breath: Secondary | ICD-10-CM

## 2016-07-16 DIAGNOSIS — I2510 Atherosclerotic heart disease of native coronary artery without angina pectoris: Secondary | ICD-10-CM | POA: Diagnosis not present

## 2016-07-16 DIAGNOSIS — Z79899 Other long term (current) drug therapy: Secondary | ICD-10-CM | POA: Diagnosis not present

## 2016-07-16 DIAGNOSIS — G40909 Epilepsy, unspecified, not intractable, without status epilepticus: Secondary | ICD-10-CM

## 2016-07-16 DIAGNOSIS — R0789 Other chest pain: Secondary | ICD-10-CM | POA: Diagnosis not present

## 2016-07-16 DIAGNOSIS — I252 Old myocardial infarction: Secondary | ICD-10-CM | POA: Insufficient documentation

## 2016-07-16 DIAGNOSIS — E119 Type 2 diabetes mellitus without complications: Secondary | ICD-10-CM | POA: Diagnosis not present

## 2016-07-16 HISTORY — DX: Pure hypercholesterolemia, unspecified: E78.00

## 2016-07-16 HISTORY — DX: Essential (primary) hypertension: I10

## 2016-07-16 HISTORY — DX: Type 2 diabetes mellitus without complications: E11.9

## 2016-07-16 LAB — CBC
HCT: 36 % (ref 36.0–46.0)
HEMOGLOBIN: 11.8 g/dL — AB (ref 12.0–15.0)
MCH: 31.5 pg (ref 26.0–34.0)
MCHC: 32.8 g/dL (ref 30.0–36.0)
MCV: 96 fL (ref 78.0–100.0)
PLATELETS: 289 10*3/uL (ref 150–400)
RBC: 3.75 MIL/uL — ABNORMAL LOW (ref 3.87–5.11)
RDW: 13.5 % (ref 11.5–15.5)
WBC: 5.2 10*3/uL (ref 4.0–10.5)

## 2016-07-16 LAB — LIPID PANEL
Cholesterol: 107 mg/dL (ref 0–200)
HDL: 34 mg/dL — AB (ref >40)
LDL CALC: 53 mg/dL (ref 0–99)
TRIGLYCERIDES: 101 mg/dL (ref <150)
Total CHOL/HDL Ratio: 3.1 RATIO
VLDL: 20 mg/dL (ref 0–40)

## 2016-07-16 LAB — BASIC METABOLIC PANEL
Anion gap: 10 (ref 5–15)
CO2: 23 mmol/L (ref 22–32)
CREATININE: 0.85 mg/dL (ref 0.44–1.00)
Calcium: 9.4 mg/dL (ref 8.9–10.3)
Chloride: 105 mmol/L (ref 101–111)
GFR calc Af Amer: >60 mL/min (ref >60)
GFR calc non Af Amer: >60 mL/min (ref >60)
GLUCOSE: 167 mg/dL — AB (ref 65–99)
Potassium: 4.2 mmol/L (ref 3.5–5.1)
Sodium: 138 mmol/L (ref 135–145)

## 2016-07-16 LAB — I-STAT TROPONIN, ED: TROPONIN I, POC: 0 ng/mL (ref 0.00–0.08)

## 2016-07-16 LAB — GLUCOSE, CAPILLARY
GLUCOSE-CAPILLARY: 75 mg/dL (ref 65–99)
Glucose-Capillary: 114 mg/dL — ABNORMAL HIGH (ref 65–99)

## 2016-07-16 LAB — TROPONIN I: Troponin I: <0.03 ng/mL (ref <0.03)

## 2016-07-16 MED ORDER — TRAZODONE HCL 50 MG PO TABS: 50.0000 mg | ORAL_TABLET | Freq: Every evening | ORAL | Status: DC | PRN

## 2016-07-16 MED ORDER — DIPHENHYDRAMINE-APAP (SLEEP) 25-500 MG PO TABS: 1.0000 | ORAL_TABLET | Freq: Every evening | ORAL | Status: DC | PRN

## 2016-07-16 MED ORDER — LEVOTHYROXINE SODIUM 75 MCG PO TABS
75.0000 ug | ORAL_TABLET | Freq: Every day | ORAL | Status: DC
  Administered 2016-07-17: 75 ug via ORAL
  Filled 2016-07-16: qty 1

## 2016-07-16 MED ORDER — METOPROLOL SUCCINATE ER 25 MG PO TB24: 12.5000 mg | ORAL_TABLET | Freq: Every day | ORAL | Status: DC

## 2016-07-16 MED ORDER — ASPIRIN 81 MG PO CHEW
324.0000 mg | CHEWABLE_TABLET | Freq: Once | ORAL | Status: AC
  Administered 2016-07-16: 324 mg via ORAL
  Filled 2016-07-16: qty 4

## 2016-07-16 MED ORDER — SODIUM CHLORIDE 0.9 % IV BOLUS (SEPSIS)
1000.0000 mL | Freq: Once | INTRAVENOUS | Status: AC
  Administered 2016-07-16: 1000 mL via INTRAVENOUS

## 2016-07-16 MED ORDER — ACETAMINOPHEN 500 MG PO TABS: 500.0000 mg | ORAL_TABLET | Freq: Four times a day (QID) | ORAL | Status: DC | PRN

## 2016-07-16 MED ORDER — FLUTICASONE PROPIONATE 50 MCG/ACT NA SUSP
2.0000 | Freq: Every day | NASAL | Status: DC
  Administered 2016-07-16 – 2016-07-17 (×2): 2 via NASAL
  Filled 2016-07-16: qty 16

## 2016-07-16 MED ORDER — LAMOTRIGINE 100 MG PO TABS
200.0000 mg | ORAL_TABLET | Freq: Two times a day (BID) | ORAL | Status: DC
  Administered 2016-07-16 – 2016-07-17 (×2): 200 mg via ORAL
  Filled 2016-07-16 (×2): qty 2

## 2016-07-16 MED ORDER — ALBUTEROL SULFATE (2.5 MG/3ML) 0.083% IN NEBU: 2.5000 mg | INHALATION_SOLUTION | Freq: Four times a day (QID) | RESPIRATORY_TRACT | Status: DC | PRN

## 2016-07-16 MED ORDER — INSULIN ASPART 100 UNIT/ML ~~LOC~~ SOLN: 0.0000 [IU] | Freq: Three times a day (TID) | SUBCUTANEOUS | Status: DC

## 2016-07-16 MED ORDER — ONDANSETRON HCL 4 MG/2ML IJ SOLN: 4.0000 mg | Freq: Four times a day (QID) | INTRAMUSCULAR | Status: DC | PRN

## 2016-07-16 MED ORDER — NITROGLYCERIN 2 % TD OINT
1.0000 [in_us] | TOPICAL_OINTMENT | Freq: Once | TRANSDERMAL | Status: AC
  Administered 2016-07-16: 1 [in_us] via TOPICAL
  Filled 2016-07-16: qty 1

## 2016-07-16 MED ORDER — DIPHENHYDRAMINE HCL 25 MG PO CAPS: 25.0000 mg | ORAL_CAPSULE | Freq: Every evening | ORAL | Status: DC | PRN

## 2016-07-16 MED ORDER — LISINOPRIL 5 MG PO TABS
5.0000 mg | ORAL_TABLET | Freq: Every day | ORAL | Status: DC
  Administered 2016-07-17: 5 mg via ORAL
  Filled 2016-07-16: qty 1

## 2016-07-16 MED ORDER — METOPROLOL SUCCINATE ER 25 MG PO TB24: 25.0000 mg | ORAL_TABLET | Freq: Every day | ORAL | Status: DC

## 2016-07-16 MED ORDER — ATORVASTATIN CALCIUM 80 MG PO TABS
80.0000 mg | ORAL_TABLET | Freq: Every day | ORAL | Status: DC
  Administered 2016-07-17: 80 mg via ORAL
  Filled 2016-07-16: qty 1

## 2016-07-16 MED ORDER — BUSPIRONE HCL 5 MG PO TABS
5.0000 mg | ORAL_TABLET | Freq: Two times a day (BID) | ORAL | Status: DC
  Administered 2016-07-16 – 2016-07-17 (×2): 5 mg via ORAL
  Filled 2016-07-16 (×2): qty 1

## 2016-07-16 MED ORDER — ALBUTEROL SULFATE HFA 108 (90 BASE) MCG/ACT IN AERS: 2.0000 | INHALATION_SPRAY | Freq: Four times a day (QID) | RESPIRATORY_TRACT | Status: DC | PRN

## 2016-07-16 MED ORDER — METOPROLOL TARTRATE 12.5 MG HALF TABLET: 12.5000 mg | ORAL_TABLET | Freq: Two times a day (BID) | ORAL | Status: DC

## 2016-07-16 MED ORDER — ENOXAPARIN SODIUM 40 MG/0.4ML ~~LOC~~ SOLN
40.0000 mg | SUBCUTANEOUS | Status: DC
  Administered 2016-07-16: 40 mg via SUBCUTANEOUS
  Filled 2016-07-16: qty 0.4

## 2016-07-16 MED ORDER — LAMOTRIGINE ER 200 MG PO TB24: 400.0000 mg | ORAL_TABLET | Freq: Every day | ORAL | Status: DC

## 2016-07-16 MED ORDER — ASPIRIN EC 81 MG PO TBEC
81.0000 mg | DELAYED_RELEASE_TABLET | Freq: Every day | ORAL | Status: DC
  Administered 2016-07-17: 81 mg via ORAL
  Filled 2016-07-16 (×2): qty 1

## 2016-07-16 NOTE — H&P (Signed)
Family Medicine [facility] History and Physical Service Pager: [phone]  Patient name: [name] [name] Medical record number: [id number] Date of birth: 05/06/1951 Age: 66 y.o. Gender: female  Primary Care Provider: Marcell Angerandice P Clark, NP Consultants: None Code Status: Full (confirmed on admission)  Chief Complaint: chest pain  Assessment and Plan: [name] [name] is a 66 y.o. female presenting with central chest pain. PMH is significant for T2DM, CAD with CABG in 2013, thyroidectomy in 2017, schizophrenia, seizure disorder, HLD, HTN, and tobacco abuse.   #Chest pain: Reports as centralized pain and pain in her breasts, which daughter says is ongoing issues due to "fibroids" of breast. Could not reproduce on exam. Resolved by time of admission s/p nitro paste. Heart score of 4 given age and significant CAD history. TSH 0.830 07/14/16. Lipid panel with LDL of 53. EKG with evidence of old anterior infarct and lateral T-wave inversions, unchanged from previous on 06/06/16. Troponin negative x 2. Negative CXR. - Admit to observation on telemetry, Dr. McDiarmid - Repeat EKG in a.m. - Obtain 3rd troponin - Continue home asa and lipitor - Tylenol prn for discomfort  HTN/CAD/HLD: Normotensive on admission at 135/52 - Continue lipitor 80 mg daily, aspirin 81 mg daily, metoprolol succinate 12.5 mg daily, and lisinopril 5 mg once daily.  #Shortness of breath: [facility] as [facility] and thought not to be due to CHF due to improvement in EF to 50% in 2015 after CABG. Euvolemic on exam. CXR without venous congestion. Patient complaining more of nasal congestion and requesting nasal spray. - Ordered flonase  - Ordered prn albuterol (on home medication list)  #Epilepsy: Follows with [facility]. Takes lamotrigine XR 400 mg daily. - Lamotrigine 200 mg IR BID due to pharmacy availability  #T2DM: Well-controlled. A1c 6.5 06/14/16.  Takes glyburide-metformin 5-500 BID at home.  - sensitive SSI  #Schizophrenia: Used to be on depakote but discontinued per daughter because made patient "out of it."  #Anxiety: Takes buspar 5 mg BID. Takes ativan occasionally per daughter but none recently. - Continue home buspar  #Insomnia: Patient reports taking trazodone when she has an Rx or tylenol pm. - Ordered trazodone 50 mg QHS prn.   #Asymptomatic bradycardia: Noted previously by patient's cardiologist.   FEN/GI: soft diet Prophylaxis: lovenox  Disposition: Home pending observation overnight  History of Present Illness:  [name] [name] is a 66 y.o. female presenting with central chest and breast pain that has been ongoing for several months but felt like it was worse today and was associated with shortness of breath. Patient gave limited answers and asked that her symptoms be clarified by her daughter who provides her medications as her aide. Patient herself has several complaints including shortness of breath, mostly nasal congestion and says she has been out of nasal spray and has been using bulb suction--"need help getting sneeze or cough out." She reports she has been having a little harder time getting around with a bit of a hop or a limp. Thinks her chest pain may be related to having thyroid surgery last year. Also reports mouth is dry and says she is in the process of getting fitted for dentures. Called patient's daughter [name] [name] who says her mother "is at the doctor's a lot." Says her mother has a history of breast fibroids, especially of the right.   [facility] for mouth pain thought to be secondary to gum ulcers and was discharged after having  unchanged EKG and negative troponin.   Review Of Systems: Per HPI with the following additions:   Review of Systems  Constitutional: Positive for chills and malaise/fatigue. Negative for fever.  HENT: Positive for congestion. Negative for sore  throat.   Eyes: Positive for blurred vision (needs glasses). Negative for pain.  Respiratory: Positive for shortness of breath. Negative for hemoptysis.   Cardiovascular: Positive for chest pain. Negative for leg swelling.  Gastrointestinal: Positive for abdominal pain and constipation (at times). Negative for nausea and vomiting.  Genitourinary: Positive for frequency. Negative for dysuria.  Musculoskeletal: Positive for falls. Negative for back pain.  Skin: Negative for rash.  Neurological: Negative for speech change and headaches.  Psychiatric/Behavioral: The patient has insomnia.     Patient Active Problem List   Diagnosis Date Noted  . Schizophrenia (HCC) 04/04/2016  . Chronic systolic CHF (congestive heart failure) (HCC) 07/03/2013  . CAD (coronary artery disease) 04/10/2013  . T2DM (type 2 diabetes mellitus) (HCC) 04/03/2013  . NSTEMI (non-ST elevated myocardial infarction) (HCC) 04/03/2013  . Dyslipidemia 04/03/2013  . Epilepsy (HCC) 04/03/2013  . Tobacco abuse   . Complex partial seizure (HCC)   . Diabetes mellitus without complication [facility])     Past Medical History: Past Medical History:  Diagnosis Date  . Acute thrombus of left ventricle (HCC)   . Diabetes mellitus without complication (HCC)   . Epilepsy (HCC)   . Ischemic cardiomyopathy   . Memory deficits   . Schizo affective schizophrenia (HCC)   . Seizure disorder (HCC)   . Tobacco abuse     Past Surgical History: Past Surgical History:  Procedure Laterality Date  . CORONARY ANGIOGRAM  04/03/2013   Procedure: CORONARY ANGIOGRAM;  Surgeon: Dolores Patty, MD;  Location[facility] C[facility] LAB;  Service: Cardiovascular;;  . CORONARY ARTERY BYPASS GRAFT N/A 04/10/2013   Procedure: CORONARY ARTERY BYPASS GRAFTING (CABG);  Surgeon: Alleen Borne, MD;  Location[facility] OR;  Service: Open Heart Surgery;  Laterality: N/A;  Times 4 using left internal mammary artery and endoscopically harvested right saphenous vein.  .  INTRAOPERATIVE TRANSESOPHAGEAL ECHOCARDIOGRAM N/A 04/10/2013   Procedure: INTRAOPERATIVE TRANSESOPHAGEAL ECHOCARDIOGRAM;  Surgeon: Alleen Borne, MD;  Location: [facility] OR;  Service: Open Heart Surgery;  Laterality: N/A;  . RIGHT HEART CATHETERIZATION  04/03/2013   Procedure: RIGHT HEART CATH;  Surgeon: Dolores Patty, MD;  Location[facility] C[facility] LAB;  Service: Cardiovascular;;  . THYROIDECTOMY  02/21/2016    Social History: Social History  Substance Use Topics  . Smoking status: Current Every Day Smoker    Packs/day: 2.00    Types: Cigarettes    Last attempt to quit: 04/19/2013  . Smokeless tobacco: Not on file  . Alcohol use No   Additional social history: Friend stays with her sometimes, daughter helps her as an Engineer, production. Does not drink alcohol.  Smokes a pack a day if has the money. No drug use.  Please also refer to relevant sections of EMR.  Family History: Family History  Problem Relation Age of Onset  . Thyroid disease Mother   . Liver disease Father   . Seizures Brother   . Heart disease Sister     Allergies and Medications: No Known Allergies No current facility-administered medications on file prior to encounter.    Current Outpatient Prescriptions on File Prior to Encounter  Medication Sig Dispense Refill  . aspirin 81 MG tablet Take 81 mg by mouth daily.    Marland Kitchen atorvastatin (LIPITOR) 80 MG tablet Take  80 mg by mouth daily.    . busPIRone (BUSPAR) 10 MG tablet Take 5 mg by mouth 2 (two) times daily. May take an additional 5mg  every 8 hours    . glyBURIDE-metformin (GLUCOVANCE) 5-500 MG per tablet Take 1 tablet by mouth 2 (two) times daily with a meal.    . LamoTRIgine XR 200 MG TB24 Take 2 tablets (400 mg total) by mouth at bedtime. 180 tablet 4  . levothyroxine (SYNTHROID, LEVOTHROID) 75 MCG tablet Take by mouth.    Marland Kitchen lisinopril (PRINIVIL,ZESTRIL) 5 MG tablet Take 5 mg by mouth daily.    . metoprolol tartrate (LOPRESSOR) 25 MG tablet Take 0.5 tablets (12.5 mg total) by  mouth 2 (two) times daily. 30 tablet 3  . potassium chloride (K-DUR,KLOR-CON) 10 MEQ tablet Take 20 mEq by mouth 2 (two) times daily.     Marland Kitchen albuterol (PROVENTIL HFA;VENTOLIN HFA) 108 (90 Base) MCG/ACT inhaler Inhale into the lungs.    . divalproex (DEPAKOTE ER) 250 MG 24 hr tablet Take 2 tablets (500 mg total) by mouth at bedtime. (Patient not taking: Reported on 07/16/2016) 60 tablet 11    Objective: BP (!) 117/52   Pulse (!) 58   Temp 98 F (36.7 C) (Oral)   Resp 13   Wt 135 lb (61.2 kg)   SpO2 99%   BMI 23.91 kg/m  Exam: General: Older female, resting in bed in NAD Eyes: PERRLA, EOMI ENTM: Edentulous, swollen and erythematous nasal turbinates, no sinus tenderness to percussion Neck: Supple, FROM Cardiovascular: RRR, S1, S2, no m/r/g Respiratory: CTAB, able to speak in complete sentences with ease Gastrointestinal: +BS, soft, NT, ND MSK: No LE edema. Moves all spontaneously. Derm: Thickened toenails. No rashes or bruising noted on exposed skin.  Neuro: AOx3. Appears to have limited medical knowledge of her medical history.  Psych: Odd/somewhat flat affect, normal mood  Labs and Imaging: CBC BMET   Recent Labs Lab 07/16/16 1216  WBC 5.2  HGB 11.8*  HCT 36.0  PLT 289    Recent Labs Lab 07/16/16 1216  NA 138  K 4.2  CL 105  CO2 23  BUN <5*  CREATININE 0.85  GLUCOSE 167*  CALCIUM 9.4     Dg Chest 2 View  Result Date: 07/16/2016 CLINICAL DATA:  Chest pain . EXAM: CHEST  2 VIEW COMPARISON:  07/14/2016 . FINDINGS: Mediastinum and hilar structures normal. Lungs are clear. Prior CABG. No pleural effusion or pneumothorax. No acute bony abnormality. IMPRESSION: 1. Prior CABG. Stable cardiomegaly. No pulmonary venous congestion. 2.  No acute pulmonary disease . Electronically Signed   By: Maisie Fus  Register   On: 07/16/2016 12:40    Genae Strine Percell Boston, MD 07/16/2016, 4:46 PM PGY-2, [facility] Family Medicine FPTS Intern pager: [phone], text pages welcome

## 2016-07-16 NOTE — ED Triage Notes (Signed)
Patient complains of CP and shortness of breath that she describes as left breast pain. Describes as sharp pain and cant get a good breath, no respiratory distress on arrival.

## 2016-07-16 NOTE — ED Notes (Signed)
Pt reports left breast pain that keeps her up in the middle of the night.  It has been going on since she had thyroid surgery approx 6 months ago.  Pt reports pain is sometimes in her right breast too.  All complaints today "have been going on for a while."

## 2016-07-16 NOTE — ED Provider Notes (Signed)
[facility] DEPT Provider Note   CSN: [id number] Arrival date & time: 07/16/16  1148     History   Chief Complaint Chief Complaint  Patient presents with  . Chest Pain  . Shortness of Breath    HPI [name] [name] is a 66 y.o. female.  The history is provided by the patient.  Chest Pain   This is a recurrent (complains of "breast pain."  Used to having it all the time and it's gone away and came back a few days ago) problem. The current episode started 2 days ago. Episode frequency: intermittent. The problem has not changed since onset.The pain is associated with movement. Pain location: "breast." pain moves from breast to breast. The quality of the pain is described as sharp and stabbing. Duration of episode(s) is 2 days. Associated symptoms include shortness of breath. Pertinent negatives include no cough, no fever, no malaise/fatigue and no nausea. She has tried rest for the symptoms.  Her past medical history is significant for CAD, diabetes, hyperlipidemia and MI.  Pertinent negatives for past medical history include no hypertension.  Shortness of Breath  This is a recurrent problem. Associated symptoms include chest pain. Pertinent negatives include no fever, no rhinorrhea, no cough and no leg swelling. Associated medical issues include CAD and past MI.    Past Medical History:  Diagnosis Date  . Acute thrombus of left ventricle (HCC)   . Diabetes mellitus without complication (HCC)   . Epilepsy (HCC)   . Ischemic cardiomyopathy   . Memory deficits   . Schizo affective schizophrenia (HCC)   . Seizure disorder (HCC)   . Tobacco abuse     Patient Active Problem List   Diagnosis Date Noted  . Schizophrenia (HCC) 04/04/2016  . Chronic systolic CHF (congestive heart failure) (HCC) 07/03/2013  . CAD (coronary artery disease) 04/10/2013  . T2DM (type 2 diabetes mellitus) (HCC) 04/03/2013  . NSTEMI (non-ST elevated myocardial infarction) (HCC) 04/03/2013  . Dyslipidemia  04/03/2013  . Epilepsy (HCC) 04/03/2013  . Tobacco abuse   . Complex partial seizure (HCC)   . Diabetes mellitus without complication [facility](HCC)     Past Surgical History:  Procedure Laterality Date  . CORONARY ANGIOGRAM  04/03/2013   Procedure: CORONARY ANGIOGRAM;  Surgeon: Dolores Pattyaniel R Bensimhon, MD;  [facility];  Service: Cardiovascular;;  . CORONARY ARTERY BYPASS GRAFT N/A 04/10/2013   Procedure: CORONARY ARTERY BYPASS GRAFTING (CABG);  Surgeon: Alleen BorneBryan K Bartle, MD;  [facility];  Service: Open Heart Surgery;  Laterality: N/A;  Times 4 using left internal mammary artery and endoscopically harvested right saphenous vein.  . INTRAOPERATIVE TRANSESOPHAGEAL ECHOCARDIOGRAM N/A 04/10/2013   Procedure: INTRAOPERATIVE TRANSESOPHAGEAL ECHOCARDIOGRAM;  Surgeon: Alleen BorneBryan K Bartle, MD;  [facility];  Service: Open Heart Surgery;  Laterality: N/A;  . RIGHT HEART CATHETERIZATION  04/03/2013   Procedure: RIGHT HEART CATH;  Surgeon: Dolores Pattyaniel R Bensimhon, MD;  [facility];  Service: Cardiovascular;;  . THYROIDECTOMY  02/21/2016    OB History    No data available       Home Medications    Prior to Admission medications   Medication Sig Start Date End Date Taking? Authorizing Provider  acetaminophen (TYLENOL) 500 MG tablet Take 500 mg by mouth every 6 (six) hours as needed for mild pain.   Yes Historical Provider, MD  aspirin 81 MG tablet Take 81 mg by mouth daily.   Yes Historical Provider, MD  atorvastatin (LIPITOR) 80 MG tablet Take 80 mg by  mouth daily.   Yes Historical Provider, MD  busPIRone (BUSPAR) 10 MG tablet Take 5 mg by mouth 2 (two) times daily. May take an additional 5mg  every 8 hours 03/14/16  Yes Historical Provider, MD  diphenhydramine-acetaminophen (TYLENOL PM) 25-500 MG TABS tablet Take 1 tablet by mouth at bedtime as needed (sleep).   Yes Historical Provider, MD  glyBURIDE-metformin (GLUCOVANCE) 5-500 MG per tablet Take 1 tablet by mouth 2 (two) times daily with  a meal.   Yes Historical Provider, MD  hydrochlorothiazide (HYDRODIURIL) 25 MG tablet Take 25 mg by mouth daily.   Yes Historical Provider, MD  LamoTRIgine XR 200 MG TB24 Take 2 tablets (400 mg total) by mouth at bedtime. 09/28/15  Yes Levert Feinstein, MD  levothyroxine (SYNTHROID, LEVOTHROID) 75 MCG tablet Take by mouth. 02/29/16 02/28/17 Yes Historical Provider, MD  lisinopril (PRINIVIL,ZESTRIL) 5 MG tablet Take 5 mg by mouth daily.   Yes Historical Provider, MD  metoprolol tartrate (LOPRESSOR) 25 MG tablet Take 0.5 tablets (12.5 mg total) by mouth 2 (two) times daily. 04/15/13  Yes Erin R Barrett, PA-C  nitroGLYCERIN (NITROSTAT) 0.4 MG SL tablet Place 0.4 mg under the tongue every 5 (five) minutes as needed for chest pain.   Yes Historical Provider, MD  potassium chloride (K-DUR,KLOR-CON) 10 MEQ tablet Take 20 mEq by mouth 2 (two) times daily.  09/01/15 08/31/16 Yes Historical Provider, MD  albuterol (PROVENTIL HFA;VENTOLIN HFA) 108 (90 Base) MCG/ACT inhaler Inhale into the lungs. 09/01/15 11/30/15  Historical Provider, MD  divalproex (DEPAKOTE ER) 250 MG 24 hr tablet Take 2 tablets (500 mg total) by mouth at bedtime. Patient not taking: Reported on 07/16/2016 04/04/16   Levert Feinstein, MD    Family History Family History  Problem Relation Age of Onset  . Thyroid disease Mother   . Liver disease Father   . Seizures Brother   . Heart disease Sister     Social History Social History  Substance Use Topics  . Smoking status: Current Every Day Smoker    Packs/day: 2.00    Types: Cigarettes    Last attempt to quit: 04/19/2013  . Smokeless tobacco: Not on file  . Alcohol use No     Allergies   Patient has no known allergies.   Review of Systems Review of Systems  Constitutional: Negative for fever and malaise/fatigue.  HENT: Negative for rhinorrhea.   Respiratory: Positive for shortness of breath. Negative for cough.   Cardiovascular: Positive for chest pain. Negative for leg swelling.    Gastrointestinal: Negative for nausea.     Physical Exam Updated Vital Signs BP 120/56 (BP Location: Right Arm)   Pulse (!) 53   Temp 98 F (36.7 C) (Oral)   Resp 13   Wt 135 lb (61.2 kg)   SpO2 100%   BMI 23.91 kg/m   Physical Exam  Constitutional: She is oriented to person, place, and time. She appears well-developed and well-nourished. No distress.  HENT:  Head: Normocephalic and atraumatic.  Nose: Nose normal.  Eyes: Conjunctivae and EOM are normal. Pupils are equal, round, and reactive to light. Right eye exhibits no discharge. Left eye exhibits no discharge. No scleral icterus.  Neck: Normal range of motion. Neck supple.  Cardiovascular: Normal rate and regular rhythm.  Exam reveals no gallop and no friction rub.   No murmur heard. Pulmonary/Chest: Effort normal and breath sounds normal. No stridor. No respiratory distress. She has no rales.  Abdominal: Soft. She exhibits no distension. There is no tenderness.  Musculoskeletal: She exhibits no edema or tenderness.  Neurological: She is alert and oriented to person, place, and time.  Skin: Skin is warm and dry. No rash noted. She is not diaphoretic. No erythema.  Psychiatric: She has a normal mood and affect.  Vitals reviewed.    ED Treatments / Results  Labs (all labs ordered are listed, but only abnormal results are displayed) Labs Reviewed  BASIC METABOLIC PANEL - Abnormal; Notable for the following:       Result Value   Glucose, Bld 167 (*)    BUN <5 (*)    All other components within normal limits  CBC - Abnormal; Notable for the following:    RBC 3.75 (*)    Hemoglobin 11.8 (*)    All other components within normal limits  I-STAT TROPOININ, ED    EKG  EKG Interpretation  Date/Time:  Monday July 16 2016 12:16:26 EST Ventricular Rate:  57 PR Interval:  130 QRS Duration: 78 QT Interval:  450 QTC Calculation: 438 R Axis:   24 Text Interpretation:  Sinus bradycardia Cannot rule out Anterior  infarct , age undetermined Abnormal ECG Nonspecific T wave abnormality, improved in Otherwise no significant change Confirmed by [facility] MD, Ester Hilley [phone]) on 07/16/2016 1:22:56 PM       Radiology Dg Chest 2 View  Result Date: 07/16/2016 CLINICAL DATA:  Chest pain . EXAM: CHEST  2 VIEW COMPARISON:  07/14/2016 . FINDINGS: Mediastinum and hilar structures normal. Lungs are clear. Prior CABG. No pleural effusion or pneumothorax. No acute bony abnormality. IMPRESSION: 1. Prior CABG. Stable cardiomegaly. No pulmonary venous congestion. 2.  No acute pulmonary disease . Electronically Signed   By: Maisie Fus  Register   On: 07/16/2016 12:40    Procedures Procedures (including critical care time)  Medications Ordered in ED Medications  aspirin chewable tablet 324 mg (324 mg Oral Given 07/16/16 1337)  nitroGLYCERIN (NITROGLYN) 2 % ointment 1 inch (1 inch Topical Given 07/16/16 1338)  sodium chloride 0.9 % bolus 1,000 mL (1,000 mLs Intravenous New Bag/Given 07/16/16 1558)     Initial Impression / Assessment and Plan / ED Course  I have reviewed the triage vital signs and the nursing notes.  Pertinent labs & imaging results that were available during my care of the patient were reviewed by me and considered in my medical decision making (see chart for details).     Atypical chest pain. However her pain significantly improved with nitroglycerin. EKG with improved T-wave inversions. Initial troponin negative. Chest x-ray without evidence suggestive of pneumonia, pneumothorax, pneumomediastinum.  No abnormal contour of the mediastinum to suggest dissection. No evidence of acute injuries. Given the patient's prior history of MI status post CABG will admit for ACS rule out.   Low suspicion for pulmonary embolism. Presentation is classic for aortic dissection or esophageal perforation.   Final Clinical Impressions(s) / ED Diagnoses   Final diagnoses:  Chest pain, unspecified type  Shortness of breath      Nira Conn, MD 07/16/16 [phone]

## 2016-07-16 NOTE — ED Notes (Signed)
Attempted report 

## 2016-07-17 DIAGNOSIS — I2510 Atherosclerotic heart disease of native coronary artery without angina pectoris: Secondary | ICD-10-CM | POA: Diagnosis not present

## 2016-07-17 DIAGNOSIS — R0602 Shortness of breath: Secondary | ICD-10-CM | POA: Diagnosis not present

## 2016-07-17 DIAGNOSIS — R0789 Other chest pain: Secondary | ICD-10-CM | POA: Diagnosis not present

## 2016-07-17 DIAGNOSIS — E119 Type 2 diabetes mellitus without complications: Secondary | ICD-10-CM | POA: Diagnosis not present

## 2016-07-17 LAB — HIV ANTIBODY (ROUTINE TESTING W REFLEX): HIV Screen 4th Generation wRfx: NONREACTIVE

## 2016-07-17 LAB — TROPONIN I: Troponin I: <0.03 ng/mL (ref <0.03)

## 2016-07-17 LAB — GLUCOSE, CAPILLARY: Glucose-Capillary: 67 mg/dL (ref 65–99)

## 2016-07-17 MED ORDER — METOPROLOL SUCCINATE ER 25 MG PO TB24: 12.5000 mg | ORAL_TABLET | Freq: Every day | ORAL | Status: AC

## 2016-07-17 MED ORDER — FLUTICASONE PROPIONATE 50 MCG/ACT NA SUSP: 2.0000 | Freq: Every day | NASAL | 0 refills | Status: AC

## 2016-07-17 MED ORDER — ALBUTEROL SULFATE HFA 108 (90 BASE) MCG/ACT IN AERS: 2.0000 | INHALATION_SPRAY | Freq: Four times a day (QID) | RESPIRATORY_TRACT | 0 refills | Status: AC | PRN

## 2016-07-17 NOTE — Discharge Summary (Signed)
[facility] Discharge Summary  Patient name: [name] MooresBetty Benbrook Medical record number: [id number] Date of birth: 01/14/1951 Age: 66 y.o. Gender: female Date of Admission: 07/16/2016  Date of Discharge: 07/17/16  Admitting Physician: Leighton Roachodd D McDiarmid, MD  Primary Care Provider: Marcell Angerandice P Clark, NP Consultants: none  Indication for Hospitalization: ACS rule out  Discharge Diagnoses/Problem List:  Patient Active Problem List   Diagnosis Date Noted  . Shortness of breath   . Chest pain 07/16/2016  . Schizophrenia (HCC) 04/04/2016  . Chronic systolic CHF (congestive heart failure) (HCC) 07/03/2013  . CAD (coronary artery disease) 04/10/2013  . T2DM (type 2 diabetes mellitus) (HCC) 04/03/2013  . NSTEMI (non-ST elevated myocardial infarction) (HCC) 04/03/2013  . Dyslipidemia 04/03/2013  . Nonintractable epilepsy without status epilepticus (HCC) 04/03/2013  . Tobacco abuse   . Complex partial seizure (HCC)   . Diabetes mellitus without complication (HCC)     Disposition: home  Discharge Condition: stable  Discharge Exam:   Gen: elderly lady sitting up in bed in NAD Heart: RRR no MRG ,+2 radial pulses bilaterally Lungs: CTA bilaterally, no increased work of breathing Abdomen: soft, non-tender, non-distended, +BS Extremities: no edema or cyanosis.  Neuro: no focal deficits. Normal gait.    Brief Hospital Course:   [name] MooresBetty Chieffo is a 66 y.o. female with PMH of T2DM, CAD with CABG in 2013, thyroidectomy in 2017, schizophrenia, seizure disorder, HLD, HTN, and tobacco abuse who [facility]. Chest pain was atypical in nature and patient described pain in both breasts, however she did have some improvement in pain with nitroglycerin. Pain not reproducible on exam. She was place in overnight observation for ACS rule out. Troponin was trended and remained negative. EKG on admission was unchanged from prior studies. A repeat EKG the  morning of discharge was also unchanged. CXR was negative for acute cardiopulmonary process. Patient did not have further episodes of chest pain during hospitalization. Her home medications for CAD, epilepsy, anxiety, insomnia, and hypothyroidism were continued. These problems were stable during her hospitalization. Patient ambulated without chest pain or shortness of breath. She was discharged on 2/27 in stable condition to home with recommendation for close PCP follow up.  Issues for Follow Up:  1. Encourage smoking cessation 2. Follow up bradycardia and titrate dose of metoprolol as needed 3. Follow up patient's need for depakote/other schizophrenia medication as she reported not currently taking anything for this issue 4. Follow up congestion, recommended Flonase to patient  Significant Procedures: none  Significant Labs and Imaging:   Recent Labs Lab 07/16/16 1216  WBC 5.2  HGB 11.8*  HCT 36.0  PLT 289    Recent Labs Lab 07/16/16 1216  NA 138  K 4.2  CL 105  CO2 23  GLUCOSE 167*  BUN <5*  CREATININE 0.85  CALCIUM 9.4   Troponin <0.03 x 3 Lipid panel Total cholesterol 107, HDL 34, LDL 53, Triglycerides 101  Results/Tests Pending at Time of Discharge: none  Discharge Medications:  Allergies as of 07/17/2016   No Known Allergies     Medication List    STOP taking these medications   divalproex 250 MG 24 hr tablet Commonly known as:  DEPAKOTE ER   hydrochlorothiazide 25 MG tablet Commonly known as:  HYDRODIURIL   metoprolol tartrate 25 MG tablet Commonly known as:  LOPRESSOR     TAKE these medications   acetaminophen 500 MG tablet Commonly known as:  TYLENOL Take 500 mg by  mouth every 6 (six) hours as needed for mild pain.   albuterol 108 (90 Base) MCG/ACT inhaler Commonly known as:  PROVENTIL HFA;VENTOLIN HFA Inhale 2 puffs into the lungs every 6 (six) hours as needed for wheezing or shortness of breath. What changed:  how much to take  when to  take this  reasons to take this   aspirin 81 MG tablet Take 81 mg by mouth daily.   atorvastatin 80 MG tablet Commonly known as:  LIPITOR Take 80 mg by mouth daily.   busPIRone 10 MG tablet Commonly known as:  BUSPAR Take 5 mg by mouth 2 (two) times daily. May take an additional 5mg  every 8 hours   diphenhydramine-acetaminophen 25-500 MG Tabs tablet Commonly known as:  TYLENOL PM Take 1 tablet by mouth at bedtime as needed (sleep).   fluticasone 50 MCG/ACT nasal spray Commonly known as:  FLONASE Place 2 sprays into both nostrils daily.   glyBURIDE-metformin 5-500 MG tablet Commonly known as:  GLUCOVANCE Take 1 tablet by mouth 2 (two) times daily with a meal.   LamoTRIgine XR 200 MG Tb24 Take 2 tablets (400 mg total) by mouth at bedtime.   levothyroxine 75 MCG tablet Commonly known as:  SYNTHROID, LEVOTHROID Take by mouth.   lisinopril 5 MG tablet Commonly known as:  PRINIVIL,ZESTRIL Take 5 mg by mouth daily.   metoprolol succinate 25 MG 24 hr tablet Commonly known as:  TOPROL-XL Take 0.5 tablets (12.5 mg total) by mouth daily.   nitroGLYCERIN 0.4 MG SL tablet Commonly known as:  NITROSTAT Place 0.4 mg under the tongue every 5 (five) minutes as needed for chest pain.   potassium chloride 10 MEQ tablet Commonly known as:  K-DUR,KLOR-CON Take 20 mEq by mouth 2 (two) times daily.       Discharge Instructions: Please refer to Patient Instructions section of EMR for full details.  Patient was counseled important signs and symptoms that should prompt return to medical care, changes in medications, dietary instructions, activity restrictions, and follow up appointments.   Follow-Up Appointments: Follow-up Information    Candice Audrie Lia, NP. Schedule an appointment as soon as possible for a visit.   Specialty:  Internal Medicine Why:  for hospital follow-up Contact information: [address] [place] [address] [phone]            Tillman Sers, DO 07/17/2016, 9:02 AM PGY-1, [facility]

## 2016-07-17 NOTE — Discharge Instructions (Signed)
[name] [name],   Your recent chest pain does not appear to be coming from your heart. Please follow-up with your heart doctor and regular primary doctor at your earliest convenience.  Thank you for letting us take part in your care.    Chest Wall Pain Chest wall pain is pain in or around the bones and muscles of your chest. Sometimes, an injury causes this pain. Sometimes, the cause may not be known. This pain may take several weeks or longer to get better. Follow these instructions at home: Pay attention to any changes in your symptoms. Take these actions to help with your pain:  Rest as told by your doctor.  Avoid activities that cause pain. Try not to use your chest, belly (abdominal), or side muscles to lift heavy things.  If directed, apply ice to the painful area:  Put ice in a plastic bag.  Place a towel between your skin and the bag.  Leave the ice on for 20 minutes, 2-3 times per day.  Take over-the-counter and prescription medicines only as told by your doctor.  Do not use tobacco products, including cigarettes, chewing tobacco, and e-cigarettes. If you need help quitting, ask your doctor.  Keep all follow-up visits as told by your doctor. This is important. Contact a doctor if:  You have a fever.  Your chest pain gets worse.  You have new symptoms. Get help right away if:  You feel sick to your stomach (nauseous) or you throw up (vomit).  You feel sweaty or light-headed.  You have a cough with phlegm (sputum) or you cough up blood.  You are short of breath. This information is not intended to replace advice given to you by your health care provider. Make sure you discuss any questions you have with your health care provider. Document Released: 10/24/2007 Document Revised: 10/13/2015 Document Reviewed: 08/02/2014 Elsevier Interactive Patient Education  2017 [employer]

## 2016-07-17 NOTE — Progress Notes (Signed)
Patient has been given all instructions for discharge, they have been reviewed with her as well as with her sister. IV has been removed and patient is dressed and ready. Family will be here to pick her up at approximately 12 noon. All personal belongings are accounted for and in the patients possession. No questions or concerns at this time.

## 2016-10-02 ENCOUNTER — Ambulatory Visit: Payer: Medicaid Other | Admitting: Neurology

## 2016-10-03 ENCOUNTER — Other Ambulatory Visit: Payer: Self-pay | Admitting: Neurology

## 2016-12-04 ENCOUNTER — Ambulatory Visit: Payer: Medicaid Other | Admitting: Neurology

## 2017-02-13 ENCOUNTER — Other Ambulatory Visit: Payer: Self-pay | Admitting: Neurology

## 2017-03-12 ENCOUNTER — Ambulatory Visit (INDEPENDENT_AMBULATORY_CARE_PROVIDER_SITE_OTHER): Payer: Medicaid Other | Admitting: Neurology

## 2017-03-12 ENCOUNTER — Encounter: Payer: Self-pay | Admitting: Neurology

## 2017-03-12 ENCOUNTER — Encounter (INDEPENDENT_AMBULATORY_CARE_PROVIDER_SITE_OTHER): Payer: Self-pay

## 2017-03-12 VITALS — BP 100/60 | HR 70 | Ht 63.0 in | Wt 148.5 lb

## 2017-03-12 DIAGNOSIS — G40209 Localization-related (focal) (partial) symptomatic epilepsy and epileptic syndromes with complex partial seizures, not intractable, without status epilepticus: Secondary | ICD-10-CM | POA: Diagnosis not present

## 2017-03-12 MED ORDER — LAMOTRIGINE ER 200 MG PO TB24: 2.0000 | ORAL_TABLET | Freq: Every day | ORAL | 4 refills | Status: DC

## 2017-03-12 NOTE — Progress Notes (Signed)
Chief Complaint  Patient presents with  . Seizures    She is here with her daughter, [name] and her grandson, Joycie Peek.  She was taken off Depakote by her PCP due to intolerable dizziness.  Her daughter reports one seizure on 02/14/17 due to missed doses of Lamictal.      PATIENT: [name] [name] DOB: 04-20-1951  Chief Complaint  Patient presents with  . Seizures    She is here with her daughter, [name] and her grandson, Joycie Peek.  She was taken off Depakote by her PCP due to intolerable dizziness.  Her daughter reports one seizure on 02/14/17 due to missed doses of Lamictal.    HISTORICAL  [name] [name] is a 66 years old right-handed female, accompanied by her daughter [name], referred by her primary care nurse practitioner Jacqlyn Krauss for evaluation of seizure  She had a history of schizophrenia, hypertension, hyperlipidemia, type 2 diabetes, coronary artery disease, status post bypass surgery smoker  She started to have seizure around 1986, increased occurrence since 2000. She has average 1-3 seizure in one week, staring spells, drop her head, making noises, woke up confused, sometimes burn her face with cigarette stub, each episode lasts less than 5 minutes  Over the past few years, she has been taking Dilantin 300 mg every night, extra 30 mg tablet every Monday, Wednesday, Friday.  She denies gait difficulty, watches TV all day long,  I have reviewed MRI cardiac morphology in Nov 2014, left ventricle has normal size, with severely decreased ejection fraction 20-25%, hypokinesia is in the mid anterior and anteroseptal walls. There is dyskinesis of the all apical four segments and in the true apex with a thrombus in the apex measuring 28 x 20 x 18 mm. There is a transmural scar in the apical septal and inferior walls and in the true apex. There is nontransmural scar in the apical anterior, mid antero and inferoseptal walls with good prognosis if revascularized. Right ventricle has  normal size and function.There is mild mitral regurgitation and mild left atrial enlargement.  UPDATE July 26th 2016: She is with her daughter [name] today, MRI brain, EEG pending. She had 4 seizures in one week during transition from Dilantin to lamotrigine, Lamotrigine 100mg  bid works well for her, no significant side effect from lamotrigine, she only has one seizure in July 24 while taking lamotrigine 100 mg twice a day, daughter concerned that compliance is the issue  UPDATE Mar 30 2015: I have reviewed MRI of the brain with without contrast, no significant abnormality, EEG was normal.  She has no recurrent seizure, currently taking lamotrigine xr 300 mg every night   UPDATE May 10th 2017: She is with her daughter at today's clinical visit, She is taking lamotrigine xr 300mg  qhs, has been compliant with her medication, she had two more seizure, in April 26th, and early April,    UPDATE Nov 15th 2017: She had 3 recurrent seizure, she had her thyroidectomy on Feb 21 2016, is on lamotrigine xr 200 mg 2 tablets every night, has been complying with her medications, level in May was 9.3,   Daughter reported that her seizure is under much better control, but she still had right hand scar from burning when she had a seizure few weeks ago,  UPDATE Mar 12 2017: She is here with her daughter [name], and her grandson, she had one recurrent seizure February 14 2017 after missing her lamotrigine xr 200mg  2 tab every night, she could not tolerate depakote ER 500mg  qhs, it  made her very dizzy, she is no longer taking it.   REVIEW OF SYSTEMS: Full 14 system review of systems performed and notable only for: As above  ALLERGIES: No Known Allergies  HOME MEDICATIONS: Current Outpatient Prescriptions  Medication Sig Dispense Refill  . aspirin 81 MG tablet Take 81 mg by mouth daily.    Marland Kitchen atorvastatin (LIPITOR) 80 MG tablet Take 80 mg by mouth daily.    Marland Kitchen glyBURIDE-metformin (GLUCOVANCE) 5-500 MG per  tablet Take 1 tablet by mouth 2 (two) times daily with a meal.    . lisinopril (PRINIVIL,ZESTRIL) 5 MG tablet Take 5 mg by mouth daily.    Marland Kitchen LORazepam (ATIVAN) 0.5 MG tablet Take 0.5 mg by mouth every 8 (eight) hours.    . metoprolol tartrate (LOPRESSOR) 25 MG tablet Take 0.5 tablets (12.5 mg total) by mouth 2 (two) times daily. 30 tablet 3  . nitroGLYCERIN (NITROSTAT) 0.4 MG SL tablet Place 0.4 mg under the tongue.    . phenytoin (DILANTIN) 100 MG ER capsule Take 300 mg by mouth at bedtime.    . phenytoin (DILANTIN) 30 MG ER capsule TAKE ONE EXTRA  CAPSULE BY MOUTH THREE TIMES A WEEK - ON MONDAY, WEDNESDAY AND FRIDAY.       PAST MEDICAL HISTORY: Past Medical History:  Diagnosis Date  . Acute thrombus of left ventricle (HCC)   . Epilepsy (HCC)   . High cholesterol   . Hypertension   . Ischemic cardiomyopathy   . Memory deficits   . Schizo affective schizophrenia (HCC)   . Seizure disorder (HCC)   . Tobacco abuse   . Type II diabetes mellitus (HCC)     PAST SURGICAL HISTORY: Past Surgical History:  Procedure Laterality Date  . CORONARY ANGIOGRAM  04/03/2013   Procedure: CORONARY ANGIOGRAM;  Surgeon: Dolores Patty, MD;  Location: [facility] CATH LAB;  Service: Cardiovascular;;  . CORONARY ARTERY BYPASS GRAFT N/A 04/10/2013   Procedure: CORONARY ARTERY BYPASS GRAFTING (CABG);  Surgeon: Alleen Borne, MD;  Location: [facility] OR;  Service: Open Heart Surgery;  Laterality: N/A;  Times 4 using left internal mammary artery and endoscopically harvested right saphenous vein.  . INTRAOPERATIVE TRANSESOPHAGEAL ECHOCARDIOGRAM N/A 04/10/2013   Procedure: INTRAOPERATIVE TRANSESOPHAGEAL ECHOCARDIOGRAM;  Surgeon: Alleen Borne, MD;  Location: [facility] OR;  Service: Open Heart Surgery;  Laterality: N/A;  . RIGHT HEART CATHETERIZATION  04/03/2013   Procedure: RIGHT HEART CATH;  Surgeon: Dolores Patty, MD;  Location: [facility] CATH LAB;  Service: Cardiovascular;;  . THYROIDECTOMY  02/21/2016    FAMILY  HISTORY: Family History  Problem Relation Age of Onset  . Thyroid disease Mother   . Liver disease Father   . Seizures Brother   . Heart disease Sister     SOCIAL HISTORY:  Social History   Social History  . Marital status: Single    Spouse name: N/A  . Number of children: 3  . Years of education: 16   Occupational History  . Unemployed    Social History Main Topics  . Smoking status: Current Every Day Smoker    Packs/day: 2.00    Types: Cigarettes    Last attempt to quit: 04/19/2013  . Smokeless tobacco: Never Used  . Alcohol use No  . Drug use: No  . Sexual activity: Not on file   Other Topics Concern  . Not on file   Social History Narrative   Lives at home alone.   Right-handed.   Approximately two 2 liters per day  of soda.     PHYSICAL EXAM   Vitals:   03/12/17 1143  BP: 100/60  Pulse: 70  Weight: 148 lb 8 oz (67.4 kg)  Height: 5\' 3"  (1.6 m)    Not recorded      Body mass index is 26.31 kg/m.  PHYSICAL EXAMNIATION:  Gen: NAD, conversant, well nourised, obese, well groomed                     Cardiovascular: Regular rate rhythm, no peripheral edema, warm, nontender. Eyes: Conjunctivae clear without exudates or hemorrhage Neck: Supple, no carotid bruise. Pulmonary: Clear to auscultation bilaterally   NEUROLOGICAL EXAM:  MENTAL STATUS: Speech/ cognition: She depends on her daughter to provide history, depressed looking middle-age female, cooperative on neurological examinations,  CRANIAL NERVES: CN II: Visual fields are full to confrontation. Fundoscopic exam is normal with sharp discs and no vascular changes. Pupils were equal and briskly reactive to light.  CN III, IV, VI: extraocular movement are normal. No ptosis. CN V: Facial sensation is intact to pinprick in all 3 divisions bilaterally. Corneal responses are intact.  CN VII: Face is symmetric with normal eye closure and smile. CN VIII: Hearing is normal to rubbing fingers CN IX, X:  Palate elevates symmetrically. Phonation is normal. CN XI: Head turning and shoulder shrug are intact CN XII: Tongue is midline with normal movements and no atrophy.  MOTOR: There is no pronator drift of out-stretched arms. Muscle bulk and tone are normal. Muscle strength is normal.  REFLEXES: Reflexes are 2+ and symmetric at the biceps, triceps, knees, and ankles. Plantar responses are flexor.  SENSORY: Light touch, pinprick, position sense, and vibration sense are intact in fingers and toes.  COORDINATION: Rapid alternating movements and fine finger movements are intact. There is no dysmetria on finger-to-nose and heel-knee-shin. There are no abnormal or extraneous movements.   GAIT/STANCE: Posture is normal. Gait is steady with normal steps, base, arm swing, and turning. Heel and toe walking are normal. Tandem gait is normal.  Romberg is absent.   DIAGNOSTIC DATA (LABS, IMAGING, TESTING) - I reviewed patient records, labs, notes, testing and imaging myself where available.  Lab Results  Component Value Date   WBC 5.2 07/16/2016   HGB 11.8 (L) 07/16/2016   HCT 36.0 07/16/2016   MCV 96.0 07/16/2016   PLT 289 07/16/2016      Component Value Date/Time   NA 138 07/16/2016 1216   NA 140 10/21/2014 1009   K 4.2 07/16/2016 1216   CL 105 07/16/2016 1216   CO2 23 07/16/2016 1216   GLUCOSE 167 (H) 07/16/2016 1216   BUN <5 (L) 07/16/2016 1216   BUN 10 10/21/2014 1009   CREATININE 0.85 07/16/2016 1216   CALCIUM 9.4 07/16/2016 1216   PROT 7.1 10/21/2014 1009   ALBUMIN 4.3 10/21/2014 1009   AST 27 10/21/2014 1009   ALT 28 10/21/2014 1009   ALKPHOS 142 (H) 10/21/2014 1009   BILITOT 0.2 10/21/2014 1009   GFRNONAA >60 07/16/2016 1216   GFRAA >60 07/16/2016 1216   Lab Results  Component Value Date   CHOL 107 07/16/2016   HDL 34 (L) 07/16/2016   LDLCALC 53 07/16/2016   TRIG 101 07/16/2016   CHOLHDL 3.1 07/16/2016   Lab Results  Component Value Date   HGBA1C 11.9 (H)  04/03/2013   No results found for: [id number] Lab Results  Component Value Date   TSH 0.583 10/21/2014      ASSESSMENT AND PLAN  [name] [name] is a 66 y.o. female   Recurrent complex partial seizure, most recent on was in September 2018  Continue lamotrigine xr 2 tabs of 200mg  every night  Refilled her prescription History of schizophrenia, on polypharmacy treatment  Levert Feinstein, M.D. Ph.D.  [facility] [address], [address] [place], [place] [address] Ph: [phone] Fax: [phone]

## 2017-12-24 ENCOUNTER — Telehealth: Payer: Self-pay | Admitting: *Deleted

## 2017-12-24 NOTE — Telephone Encounter (Signed)
PA approved - valid through 05/20/18.

## 2017-12-24 NOTE — Telephone Encounter (Signed)
PA started via covermymeds for lamotrigine ER 200mg , two tabs at bedtime.  [facility] [phone]). Pt FA#[id number].  Decision pending.

## 2018-03-13 ENCOUNTER — Ambulatory Visit: Payer: Medicaid Other | Admitting: Nurse Practitioner

## 2018-03-26 ENCOUNTER — Ambulatory Visit: Payer: Medicaid Other | Admitting: Nurse Practitioner

## 2018-04-03 ENCOUNTER — Other Ambulatory Visit: Payer: Self-pay | Admitting: Neurology

## 2018-04-03 ENCOUNTER — Telehealth: Payer: Self-pay | Admitting: *Deleted

## 2018-04-03 NOTE — Telephone Encounter (Signed)
Received a refill request for lamotrigine.  The patient was last seen 02/2017.  It is time for her yearly follow.  I left her a message asking her to call our office to schedule this appointment.  A 90-day supply of her seizure medication has been sent to the pharmacy.  She may see Dr. Terrace ArabiaYan or NP.

## 2018-07-03 ENCOUNTER — Other Ambulatory Visit: Payer: Self-pay | Admitting: Neurology

## 2018-07-15 ENCOUNTER — Ambulatory Visit (INDEPENDENT_AMBULATORY_CARE_PROVIDER_SITE_OTHER): Payer: Medicare Other | Admitting: Neurology

## 2018-07-15 ENCOUNTER — Encounter

## 2018-07-15 ENCOUNTER — Encounter: Payer: Self-pay | Admitting: Neurology

## 2018-07-15 ENCOUNTER — Ambulatory Visit: Payer: Medicaid Other | Admitting: Nurse Practitioner

## 2018-07-15 VITALS — BP 117/66 | HR 71 | Ht 63.0 in | Wt 148.0 lb

## 2018-07-15 DIAGNOSIS — G40209 Localization-related (focal) (partial) symptomatic epilepsy and epileptic syndromes with complex partial seizures, not intractable, without status epilepticus: Secondary | ICD-10-CM | POA: Diagnosis not present

## 2018-07-15 NOTE — Progress Notes (Signed)
PATIENT: [name] [name] DOB: Oct 14, 1950  REASON FOR VISIT: follow up HISTORY FROM: patient  HISTORY OF PRESENT ILLNESS:  HISTORY  [name] [name] is a 69 years old right-handed female, accompanied by her daughter [name] [name], referred by her primary care nurse practitioner [name] [name] for evaluation of seizure  She had a history of schizophrenia, hypertension, hyperlipidemia, type 2 diabetes, coronary artery disease, status post bypass surgery smoker  She started to have seizure around 1986, increased occurrence since 2000. She has average 1-3 seizure in one week, staring spells, drop her head, making noises, woke up confused, sometimes burn her face with cigarette stub, each episode lasts less than 5 minutes  Over the past few years, she has been taking Dilantin 300 mg every night, extra 30 mg tablet every Monday, Wednesday, Friday.  She denies gait difficulty, watches TV all day long,  I have reviewed MRI cardiac morphology in Nov 2014, left ventricle has normal size, with severely decreased ejection fraction 20-25%, hypokinesia is in the mid anterior and anteroseptal walls. There is dyskinesis of the all apical four segments and in the true apex with a thrombus in the apex measuring 28 x 20 x 18 mm. There is a transmural scar in the apical septal and inferior walls and in the true apex. There is nontransmural scar in the apical anterior, mid antero and inferoseptal walls with good prognosis if revascularized. Right ventricle has normal size and function.There is mild mitral regurgitation and mild left atrial enlargement.  UPDATE July 26th 2016: She is with her daughter [name] [name] today, MRI brain, EEG pending. She had 4 seizures in one week during transition from Dilantin to lamotrigine, Lamotrigine  bid works well for her, no significant side effect from lamotrigine, she only has one seizure in July 24 while taking lamotrigine 100 mg twice a day, daughter concerned that  compliance is the issue  UPDATE Mar 30 2015: I have reviewed MRI of the brain with without contrast, no significant abnormality, EEG was normal.  She has no recurrent seizure, currently taking lamotrigine xr 300 mg every night   UPDATE May 10th 2017: She is with her daughter at today's clinical visit, She is taking lamotrigine xr  qhs, has been compliant with her medication, she had two more seizure, in April 26th, and early April,    UPDATE Nov 15th 2017: She had 3 recurrent seizure, she had her thyroidectomy on Feb 21 2016, is on lamotrigine xr 200 mg 2 tablets every night, has been complying with her medications, level in May was 9.3,   Daughter reported that her seizure is under much better control, but she still had right hand scar from burning when she had a seizure few weeks ago,  UPDATE Mar 12 2017: She is here with her daughter [name] [name], and her grandson, she had one recurrent seizure February 14 2017 after missing her lamotrigine xr  2 tab every night, she could not tolerate depakote ER  qhs, it made her very dizzy, she is no longer taking it  UPDATE July 15, 2018 SS: Ms. [name] [name] is a 68 year old female with history of recurrent complex partial seizures.  Her most recent seizure was in September 2018.  She is currently taking lamotrigine 200 mg extended release 2 tablets every night.  She is tolerating the medication well. She currently lives alone however her family is always with her.  She does not drive.  She is accompanied by her daughter at this visit.  She is able to care for  herself however her daughter does prepare her medications for the week.  In 2019, patient's mother and her aunt were killed in a car accident.  She denies any new problems or concerns today.  Her daughter is very pleased with the medication and the fact that the patient has not had a seizure in over a year.  She said prior to being on lamotrigine she would have daily seizures.   REVIEW  OF SYSTEMS: Out of a complete 14 system review of symptoms, the patient complains only of the following symptoms, and all other reviewed systems are negative.   ALLERGIES: No Known Allergies  HOME MEDICATIONS: Outpatient Medications Prior to Visit  Medication Sig Dispense Refill  . acetaminophen (TYLENOL) 500 MG tablet Take 500 mg by mouth every 6 (six) hours as needed for mild pain.    Marland Kitchen aspirin 81 MG tablet Take 81 mg by mouth daily.    Marland Kitchen atorvastatin (LIPITOR) 80 MG tablet Take 80 mg by mouth daily.    . busPIRone (BUSPAR) 10 MG tablet Take 5 mg by mouth 2 (two) times daily. May take an additional 5mg  every 8 hours    . diphenhydramine-acetaminophen (TYLENOL PM) 25-500 MG TABS tablet Take 1 tablet by mouth at bedtime as needed (sleep).    . fluticasone (FLONASE) 50 MCG/ACT nasal spray Place 2 sprays into both nostrils daily. 16 g 0  . glyBURIDE-metformin (GLUCOVANCE) 5-500 MG per tablet Take 1 tablet by mouth 2 (two) times daily with a meal.    . LamoTRIgine 200 MG TB24 24 hour tablet Take two tablets at bedtime.  Please keep appt on 07/15/2018. 180 each 0  . lisinopril (PRINIVIL,ZESTRIL) 5 MG tablet Take 5 mg by mouth daily.    . metoprolol succinate (TOPROL-XL) 25 MG 24 hr tablet Take 0.5 tablets (12.5 mg total) by mouth daily.    . nitroGLYCERIN (NITROSTAT) 0.4 MG SL tablet Place 0.4 mg under the tongue every 5 (five) minutes as needed for chest pain.    Marland Kitchen albuterol (PROVENTIL HFA;VENTOLIN HFA) 108 (90 Base) MCG/ACT inhaler Inhale 2 puffs into the lungs every 6 (six) hours as needed for wheezing or shortness of breath. 8 g 0  . levothyroxine (SYNTHROID, LEVOTHROID) 75 MCG tablet Take by mouth.     No facility-administered medications prior to visit.     PAST MEDICAL HISTORY: Past Medical History:  Diagnosis Date  . Acute thrombus of left ventricle (HCC)   . Epilepsy (HCC)   . High cholesterol   . Hypertension   . Ischemic cardiomyopathy   . Memory deficits   . Schizo affective  schizophrenia (HCC)   . Seizure disorder (HCC)   . Tobacco abuse   . Type II diabetes mellitus (HCC)     PAST SURGICAL HISTORY: Past Surgical History:  Procedure Laterality Date  . CORONARY ANGIOGRAM  04/03/2013   Procedure: CORONARY ANGIOGRAM;  Surgeon: Dolores Patty, MD;  Location[facility] CATH LAB;  Service: Cardiovascular;;  . CORONARY ARTERY BYPASS GRAFT N/A 04/10/2013   Procedure: CORONARY ARTERY BYPASS GRAFTING (CABG);  Surgeon: Alleen Borne, MD;  Location: [facility] OR;  Service: Open Heart Surgery;  Laterality: N/A;  Times 4 using left internal mammary artery and endoscopically harvested right saphenous vein.  . INTRAOPERATIVE TRANSESOPHAGEAL ECHOCARDIOGRAM N/A 04/10/2013   Procedure: INTRAOPERATIVE TRANSESOPHAGEAL ECHOCARDIOGRAM;  Surgeon: Alleen Borne, MD;  Location: [facility] OR;  Service: Open Heart Surgery;  Laterality: N/A;  . RIGHT HEART CATHETERIZATION  04/03/2013   Procedure: RIGHT HEART CATH;  Surgeon: Dolores Pattyaniel R Bensimhon, MD;  [facility];  Service: Cardiovascular;;  . THYROIDECTOMY  02/21/2016    FAMILY HISTORY: Family History  Problem Relation Age of Onset  . Thyroid disease Mother   . Liver disease Father   . Seizures Brother   . Heart disease Sister     SOCIAL HISTORY: Social History   Socioeconomic History  . Marital status: Single    Spouse name: Not on file  . Number of children: 3  . Years of education: 5511  . Highest education level: Not on file  Occupational History  . Occupation: Unemployed  Social Needs  . Financial resource strain: Not on file  . Food insecurity:    Worry: Not on file    Inability: Not on file  . Transportation needs:    Medical: Not on file    Non-medical: Not on file  Tobacco Use  . Smoking status: Current Every Day Smoker    Packs/day: 2.00    Types: Cigarettes    Last attempt to quit: 04/19/2013    Years since quitting: 5.2  . Smokeless tobacco: Never Used  Substance and Sexual Activity  . Alcohol use: No     Alcohol/week: 0.0 standard drinks  . Drug use: No  . Sexual activity: Not on file  Lifestyle  . Physical activity:    Days per week: Not on file    Minutes per session: Not on file  . Stress: Not on file  Relationships  . Social connections:    Talks on phone: Not on file    Gets together: Not on file    Attends religious service: Not on file    Active member of club or organization: Not on file    Attends meetings of clubs or organizations: Not on file    Relationship status: Not on file  . Intimate partner violence:    Fear of current or ex partner: Not on file    Emotionally abused: Not on file    Physically abused: Not on file    Forced sexual activity: Not on file  Other Topics Concern  . Not on file  Social History Narrative   Lives at home alone.   Right-handed.   Approximately two 2 liters per day of soda.      PHYSICAL EXAM  Vitals:   07/15/18 1022  BP: 117/66  Pulse: 71  Weight: 148 lb (67.1 kg)  Height: 5\' 3"  (1.6 m)   Body mass index is 26.22 kg/m.  Generalized: Well developed, in no acute distress   Neurological examination  Mentation: Alert oriented to time, place, history taking. Follows all commands speech and language fluent.  Flat affect.  Daughter provided most of the history and details.  Patient had some difficulty following commands during the exam Cranial nerve II-XII: Pupils were equal round reactive to light. Extraocular movements were full, visual field were full on confrontational test. Facial sensation and strength were normal. Uvula tongue midline. Head turning and shoulder shrug  were normal and symmetric. Motor: The motor testing reveals 4 over 5 strength of all 4 extremities. Good symmetric motor tone is noted throughout.  Sensory: Sensory testing is intact to soft touch on all 4 extremities. No evidence of extinction is noted.  Coordination: Cerebellar testing reveals good finger-nose-finger and heel-to-shin bilaterally.  Gait and  station: Gait is normal. Tandem gait is mildly unsteady. Romberg is negative. No drift is seen.  Reflexes: Deep tendon reflexes are symmetric and normal bilaterally.  DIAGNOSTIC DATA (LABS, IMAGING, TESTING) - I reviewed patient records, labs, notes, testing and imaging myself where available.  Lab Results  Component Value Date   WBC 5.2 07/16/2016   HGB 11.8 (L) 07/16/2016   HCT 36.0 07/16/2016   MCV 96.0 07/16/2016   PLT 289 07/16/2016      Component Value Date/Time   NA 138 07/16/2016 1216   NA 140 10/21/2014 1009   K 4.2 07/16/2016 1216   CL 105 07/16/2016 1216   CO2 23 07/16/2016 1216   GLUCOSE 167 (H) 07/16/2016 1216   BUN <5 (L) 07/16/2016 1216   BUN 10 10/21/2014 1009   CREATININE 0.85 07/16/2016 1216   CALCIUM 9.4 07/16/2016 1216   PROT 7.1 10/21/2014 1009   ALBUMIN 4.3 10/21/2014 1009   AST 27 10/21/2014 1009   ALT 28 10/21/2014 1009   ALKPHOS 142 (H) 10/21/2014 1009   BILITOT 0.2 10/21/2014 1009   GFRNONAA >60 07/16/2016 1216   GFRAA >60 07/16/2016 1216   Lab Results  Component Value Date   CHOL 107 07/16/2016   HDL 34 (L) 07/16/2016   LDLCALC 53 07/16/2016   TRIG 101 07/16/2016   CHOLHDL 3.1 07/16/2016   Lab Results  Component Value Date   HGBA1C 11.9 (H) 04/03/2013   No results found for: [id number] Lab Results  Component Value Date   TSH 0.583 10/21/2014      ASSESSMENT AND PLAN 68 y.o. year old female  has a past medical history of Acute thrombus of left ventricle (HCC), Epilepsy (HCC), High cholesterol, Hypertension, Ischemic cardiomyopathy, Memory deficits, Schizo affective schizophrenia (HCC), Seizure disorder (HCC), Tobacco abuse, and Type II diabetes mellitus (HCC). here with:  1.  Recurrent complex partial seizure  Overall she is doing very well taking lamotrigine 200 mg extended release 2 tablets at bedtime.  Her last seizure was in September 2018.  She is tolerating the medication well and denies any problems.  I will check a blood  level today.  She is not driving a car. She will follow-up in our office in 1 year or sooner if needed.  I advised her that if her symptoms worsen or she should develop any new symptoms she should let us know.  Her daughter reports a refill was just sent in and is not needed at this time.  I spent 15 minutes with the patient. 50% of this time was spent discussing her plan of care.   Margie Ege, AGNP-C, DNP 07/15/2018, 10:56 AM [facility] [address] [place], [place] [address] [phone]

## 2018-07-15 NOTE — Progress Notes (Signed)
I have reviewed and agreed above plan. 

## 2018-07-17 LAB — LAMOTRIGINE LEVEL: Lamotrigine Lvl: 7.9 ug/mL (ref 2.0–20.0)

## 2018-08-28 DIAGNOSIS — K5790 Diverticulosis of intestine, part unspecified, without perforation or abscess without bleeding: Secondary | ICD-10-CM | POA: Insufficient documentation

## 2018-10-01 ENCOUNTER — Other Ambulatory Visit: Payer: Self-pay | Admitting: Neurology

## 2018-11-05 DIAGNOSIS — Z9181 History of falling: Secondary | ICD-10-CM | POA: Insufficient documentation

## 2019-06-17 ENCOUNTER — Telehealth: Payer: Self-pay

## 2019-06-17 NOTE — Telephone Encounter (Signed)
1) Medication(s) Requested (by name): LamoTRIgine 200 MG TB24 24 hour ta  2) Pharmacy of Choice: [facility] [address] [place], [place] - [address] [place] [address]

## 2019-06-18 MED ORDER — LAMOTRIGINE ER 200 MG PO TB24: ORAL_TABLET | ORAL | 0 refills | Status: DC

## 2019-06-18 NOTE — Telephone Encounter (Signed)
I called pt, spoke to daughter and she needs the lamotrigine (enough to get to appt).  Did for 3 month supply.  Confirmed appt date and time.

## 2019-07-22 ENCOUNTER — Ambulatory Visit (INDEPENDENT_AMBULATORY_CARE_PROVIDER_SITE_OTHER): Payer: Medicare Other | Admitting: Neurology

## 2019-07-22 ENCOUNTER — Other Ambulatory Visit: Payer: Self-pay

## 2019-07-22 ENCOUNTER — Encounter: Payer: Self-pay | Admitting: Neurology

## 2019-07-22 VITALS — BP 93/49 | HR 75 | Temp 97.4°F | Ht 63.0 in | Wt 140.2 lb

## 2019-07-22 DIAGNOSIS — G40209 Localization-related (focal) (partial) symptomatic epilepsy and epileptic syndromes with complex partial seizures, not intractable, without status epilepticus: Secondary | ICD-10-CM | POA: Diagnosis not present

## 2019-07-22 MED ORDER — LAMOTRIGINE ER 200 MG PO TB24: ORAL_TABLET | ORAL | 3 refills | Status: DC

## 2019-07-22 NOTE — Progress Notes (Signed)
PATIENT: [name] [name] DOB: 12-03-1950  REASON FOR VISIT: follow up HISTORY FROM: patient  HISTORY OF PRESENT ILLNESS: Today 07/22/19  HISTORY HISTORY  [name] [name] a 69 years old right-handed female, accompanied by her daughter [name] [name], referred by her primary care nurse practitioner [name] [name] for evaluation of seizure  She had a history of schizophrenia, hypertension, hyperlipidemia, type 2 diabetes, coronary artery disease, status post bypass surgery smoker  She started to have seizure around 1986, increased occurrence since 2000. She has average 1-3 seizure in one week, staring spells, drop her head, making noises, woke up confused, sometimes burn her face with cigarette stub, each episode lasts less than 5 minutes  Over the past few years, she has been taking Dilantin 300 mg every night, extra 30 mg tablet every Monday, Wednesday, Friday.  She denies gait difficulty, watches TV all day long,  I have reviewed MRI cardiac morphology in Nov 2014, left ventricle has normal size, with severely decreased ejection fraction 20-25%, hypokinesia is in the mid anteriorand anteroseptal walls. There is dyskinesis of the allapical four segments and in the true apex with a thrombus in theapex measuring 28 x 20 x 18 mm. There is a transmural scar in the apical septal and inferior walls and in the true apex. There is nontransmural scar in the apical anterior, mid antero and inferoseptal walls with good prognosis if revascularized. Right ventricle has normal size and function.There is mild mitral regurgitation and mild left atrial enlargement.  UPDATE July 26th 2016: She is with her daughter [name] [name] today, MRI brain, EEG pending. She had 4 seizures in one week during transition from Dilantin to lamotrigine, Lamotrigine 100mg  bid works well for her, no significant side effect from lamotrigine, she only has one seizure in July 24 while taking lamotrigine 100 mg twice a day, daughter  concerned that compliance is the issue  UPDATE Mar 30 2015: I have reviewed MRI of the brain with without contrast, no significant abnormality, EEG was normal.  She has no recurrent seizure, currently taking lamotrigine xr 300 mg every night   UPDATE May 10th 2017: She is with her daughter at today's clinical visit, She is taking lamotrigine xr 300mg  qhs, has been compliant with her medication, she had two more seizure, in April 26th, and early April,   UPDATE Nov 15th 2017: She had 3 recurrent seizure, she had her thyroidectomy on Feb 21 2016, is on lamotrigine xr 200 mg 2 tablets every night, has been complying with her medications, level in May was 9.3,   Daughter reported that her seizure is undermuch bettercontrol, but she still had right hand scar from burning when she had a seizure few weeks ago,  UPDATE Mar 12 2017: She is here with her daughter [name] [name], and her grandson, she had one recurrent seizure September 27 2018after missing her lamotrigine xr 200mg  2 tab every night, she could not tolerate depakote ER 500mg  qhs, it made her very dizzy, she is no longer taking it  UPDATE July 15, 2018 SS: Ms. 10-15-1983 is a 69 year old female with history of recurrent complex partial seizures.  Her most recent seizure was in September 2018.  She is currently taking lamotrigine 200 mg extended release 2 tablets every night.  She is tolerating the medication well. She currently lives alone however her family is always with her.  She does not drive.  She is accompanied by her daughter at this visit.  She is able to care for herself however her daughter does  prepare her medications for the week.  In 2019, patient's mother and her aunt were killed in a car accident.  She denies any new problems or concerns today.  Her daughter is very pleased with the medication and the fact that the patient has not had a seizure in over a year.  She said prior to being on lamotrigine she would have daily  seizures.   Update July 22, 2019 SS: Here with her daughter today.  She has been doing well, has not had recurrent seizure.  She lives alone, but her family is always with her.  She does not drive.  She is tolerating lamotrigine XR 200 mg, 2 at bedtime.  Her daughter fixes her weekly pills.  Her overall health has been good.  No new problems or concerns.  Her daughter says she does not have schizophrenia.  Sees PCP for routine labs.  Last appointment, Lamictal level 7.9. BP 95/56, asymptomatic today.  REVIEW OF SYSTEMS: Out of a complete 14 system review of symptoms, the patient complains only of the following symptoms, and all other reviewed systems are negative.  Seizures   ALLERGIES: No Known Allergies  HOME MEDICATIONS: Outpatient Medications Prior to Visit  Medication Sig Dispense Refill  . acetaminophen (TYLENOL) 500 MG tablet Take 500 mg by mouth every 6 (six) hours as needed for mild pain.    Marland Kitchen aspirin 81 MG tablet Take 81 mg by mouth daily.    Marland Kitchen atorvastatin (LIPITOR) 80 MG tablet Take 80 mg by mouth daily.    . busPIRone (BUSPAR) 10 MG tablet Take 5 mg by mouth 2 (two) times daily. May take an additional 5mg  every 8 hours    . diphenhydramine-acetaminophen (TYLENOL PM) 25-500 MG TABS tablet Take 1 tablet by mouth at bedtime as needed (sleep).    . fluticasone (FLONASE) 50 MCG/ACT nasal spray Place 2 sprays into both nostrils daily. 16 g 0  . glyBURIDE-metformin (GLUCOVANCE) 5-500 MG per tablet Take 1 tablet by mouth 2 (two) times daily with a meal.    . levothyroxine (SYNTHROID, LEVOTHROID) 75 MCG tablet Take by mouth.    lisinopril (PRINIVIL,ZESTRIL) 5 MG tablet Take 5 mg by mouth daily.    . metoprolol succinate (TOPROL-XL) 25 MG 24 hr tablet Take 0.5 tablets (12.5 mg total) by mouth daily.    . nitroGLYCERIN (NITROSTAT) 0.4 MG SL tablet Place 0.4 mg under the tongue every 5 (five) minutes as needed for chest pain.    . LamoTRIgine 200 MG TB24 24 hour tablet TAKE 2 TABLETS BY  MOUTH AT BEDTIME. 180 tablet 0  . albuterol (PROVENTIL HFA;VENTOLIN HFA) 108 (90 Base) MCG/ACT inhaler Inhale 2 puffs into the lungs every 6 (six) hours as needed for wheezing or shortness of breath. 8 g 0   No facility-administered medications prior to visit.    PAST MEDICAL HISTORY: Past Medical History:  Diagnosis Date  . Acute thrombus of left ventricle (HCC)   . Epilepsy (HCC)   . High cholesterol   . Hypertension   . Ischemic cardiomyopathy   . Memory deficits   . Schizo affective schizophrenia (HCC)   . Seizure disorder (HCC)   . Tobacco abuse   . Type II diabetes mellitus (HCC)     PAST SURGICAL HISTORY: Past Surgical History:  Procedure Laterality Date  . CORONARY ANGIOGRAM  04/03/2013   Procedure: CORONARY ANGIOGRAM;  Surgeon: 04/05/2013, MD;  Location: [facility] CATH LAB;  Service: Cardiovascular;;  . CORONARY ARTERY BYPASS GRAFT N/A  04/10/2013   Procedure: CORONARY ARTERY BYPASS GRAFTING (CABG);  Surgeon: Gaye Pollack, MD;  Location: [place];  Service: Open Heart Surgery;  Laterality: N/A;  Times 4 using left internal mammary artery and endoscopically harvested right saphenous vein.  . INTRAOPERATIVE TRANSESOPHAGEAL ECHOCARDIOGRAM N/A 04/10/2013   Procedure: INTRAOPERATIVE TRANSESOPHAGEAL ECHOCARDIOGRAM;  Surgeon: Gaye Pollack, MD;  Location: [facility] OR;  Service: Open Heart Surgery;  Laterality: N/A;  . RIGHT HEART CATHETERIZATION  04/03/2013   Procedure: RIGHT HEART CATH;  Surgeon: Jolaine Artist, MD;  Location: [facility] CATH LAB;  Service: Cardiovascular;;  . THYROIDECTOMY  02/21/2016    FAMILY HISTORY: Family History  Problem Relation Age of Onset  . Thyroid disease Mother   . Liver disease Father   . Seizures Brother   . Heart disease Sister     SOCIAL HISTORY: Social History   Socioeconomic History  . Marital status: Single    Spouse name: Not on file  . Number of children: 3  . Years of education: 34  . Highest education level: Not on file   Occupational History  . Occupation: Unemployed  Tobacco Use  . Smoking status: Current Every Day Smoker    Packs/day: 2.00    Types: Cigarettes    Last attempt to quit: 04/19/2013    Years since quitting: 6.2  . Smokeless tobacco: Never Used  Substance and Sexual Activity  . Alcohol use: No    Alcohol/week: 0.0 standard drinks  . Drug use: No  . Sexual activity: Not on file  Other Topics Concern  . Not on file  Social History Narrative   Lives at home alone.   Right-handed.   Approximately two 2 liters per day of soda.   Social Determinants of Health   Financial Resource Strain:   . Difficulty of Paying Living Expenses: Not on file  Food Insecurity:   . Worried About Charity fundraiser in the Last Year: Not on file  . Ran Out of Food in the Last Year: Not on file  Transportation Needs:   . Lack of Transportation (Medical): Not on file  . Lack of Transportation (Non-Medical): Not on file  Physical Activity:   . Days of Exercise per Week: Not on file  . Minutes of Exercise per Session: Not on file  Stress:   . Feeling of Stress : Not on file  Social Connections:   . Frequency of Communication with Friends and Family: Not on file  . Frequency of Social Gatherings with Friends and Family: Not on file  . Attends Religious Services: Not on file  . Active Member of Clubs or Organizations: Not on file  . Attends Archivist Meetings: Not on file  . Marital Status: Not on file  Intimate Partner Violence:   . Fear of Current or Ex-Partner: Not on file  . Emotionally Abused: Not on file  . Physically Abused: Not on file  . Sexually Abused: Not on file   PHYSICAL EXAM  Vitals:   07/22/19 1025  BP: (!) 93/49  Pulse: 75  Temp: (!) 97.4 F (36.3 C)  Weight: 140 lb 3.2 oz (63.6 kg)  Height: 5\' 3"  (1.6 m)   Body mass index is 24.84 kg/m.  Generalized: Well developed, in no acute distress, repeat BP 95/56 Neurological examination  Mentation: Alert oriented  to time, place. Follows all commands speech and language fluent.  Most of history is provided by daughter, flat affect. Cranial nerve II-XII: Pupils were equal round reactive  to light. Extraocular movements were full, visual field were full on confrontational test. Facial sensation and strength were normal. Head turning and shoulder shrug were normal and symmetric. Motor: Good strength of all extremities Sensory: Sensory testing is intact to soft touch on all 4 extremities. No evidence of extinction is noted.  Coordination: Cerebellar testing reveals good finger-nose-finger and heel-to-shin bilaterally.  Gait and station: Able to rise from seated position without pushoff, gait is normal, no assistive device, tandem gait is unsteady. Reflexes: Deep tendon reflexes are symmetric, 2+  DIAGNOSTIC DATA (LABS, IMAGING, TESTING) - I reviewed patient records, labs, notes, testing and imaging myself where available.  Lab Results  Component Value Date   WBC 5.2 07/16/2016   HGB 11.8 (L) 07/16/2016   HCT 36.0 07/16/2016   MCV 96.0 07/16/2016   PLT 289 07/16/2016      Component Value Date/Time   NA 138 07/16/2016 1216   NA 140 10/21/2014 1009   K 4.2 07/16/2016 1216   CL 105 07/16/2016 1216   CO2 23 07/16/2016 1216   GLUCOSE 167 (H) 07/16/2016 1216   BUN <5 (L) 07/16/2016 1216   BUN 10 10/21/2014 1009   CREATININE 0.85 07/16/2016 1216   CALCIUM 9.4 07/16/2016 1216   PROT 7.1 10/21/2014 1009   ALBUMIN 4.3 10/21/2014 1009   AST 27 10/21/2014 1009   ALT 28 10/21/2014 1009   ALKPHOS 142 (H) 10/21/2014 1009   BILITOT 0.2 10/21/2014 1009   GFRNONAA >60 07/16/2016 1216   GFRAA >60 07/16/2016 1216   Lab Results  Component Value Date   CHOL 107 07/16/2016   HDL 34 (L) 07/16/2016   LDLCALC 53 07/16/2016   TRIG 101 07/16/2016   CHOLHDL 3.1 07/16/2016   Lab Results  Component Value Date   HGBA1C 11.9 (H) 04/03/2013   No results found for: [id number] Lab Results  Component Value Date    TSH 0.583 10/21/2014   ASSESSMENT AND PLAN 69 y.o. year old female  has a past medical history of Acute thrombus of left ventricle (HCC), Epilepsy (HCC), High cholesterol, Hypertension, Ischemic cardiomyopathy, Memory deficits, Schizo affective schizophrenia (HCC), Seizure disorder (HCC), Tobacco abuse, and Type II diabetes mellitus (HCC). here with:  1.  Complex partial seizure -Overall, doing well, no recurrent seizure, tolerating medication without side effect -Continue lamotrigine 200 mg XR, 2 tablets at bedtime -Her last seizure was in September 2018 -We will check Lamictal level -Call for recurrent seizure, otherwise follow-up 1 year or sooner if needed  I spent 15 minutes with the patient. 50% of this time was spent discussing her plan of care.  Margie Ege, AGNP-C, DNP 07/22/2019, 11:05 AM [facility] [address], [address] [place], [place] [address] [phone]

## 2019-07-22 NOTE — Progress Notes (Signed)
I have reviewed and agreed above plan. 

## 2019-07-22 NOTE — Patient Instructions (Signed)
It was good to see you today Continue taking Lamictal as prescribed Check blood level today  See you in 1 year, call for seizure

## 2019-07-24 LAB — LAMOTRIGINE LEVEL: Lamotrigine Lvl: 11.5 ug/mL (ref 2.0–20.0)

## 2019-07-27 ENCOUNTER — Telehealth: Payer: Self-pay

## 2019-07-27 NOTE — Telephone Encounter (Signed)
Called to go over pt lab results per provider request. Daughter answered. No DPR on file.   Informed daughter to have mother call us back.  Please relay results... "Please call the patient. Lamictal was therapeutic. No change in dosing"- NP Margie Ege

## 2019-07-27 NOTE — Telephone Encounter (Signed)
Pt called back for her results. Please follow up.

## 2019-07-27 NOTE — Telephone Encounter (Signed)
Attempted to reach back out to go over recent labs. Daughter answered. No DPR please see previous messages.

## 2019-07-28 NOTE — Telephone Encounter (Signed)
Pt's daughter [name], on last scanned DPR called for pts results. Please call back when available.

## 2019-08-04 NOTE — Telephone Encounter (Signed)
Called daughter, [name] on 2016 DPR, LVM informing her that patient's Lamictal was therapeutic. There will be no change in dosing. Left # for questions.

## 2019-10-14 DIAGNOSIS — M85831 Other specified disorders of bone density and structure, right forearm: Secondary | ICD-10-CM | POA: Insufficient documentation

## 2020-01-01 DIAGNOSIS — E039 Hypothyroidism, unspecified: Secondary | ICD-10-CM | POA: Insufficient documentation

## 2020-07-21 ENCOUNTER — Ambulatory Visit: Payer: Medicare Other | Admitting: Neurology

## 2020-08-12 ENCOUNTER — Other Ambulatory Visit: Payer: Self-pay | Admitting: Neurology

## 2020-08-15 ENCOUNTER — Other Ambulatory Visit: Payer: Self-pay

## 2020-10-12 ENCOUNTER — Ambulatory Visit (INDEPENDENT_AMBULATORY_CARE_PROVIDER_SITE_OTHER): Payer: Medicare Other | Admitting: Neurology

## 2020-10-12 ENCOUNTER — Encounter: Payer: Self-pay | Admitting: Neurology

## 2020-10-12 ENCOUNTER — Other Ambulatory Visit: Payer: Self-pay

## 2020-10-12 VITALS — BP 110/64 | HR 62 | Ht 63.0 in | Wt 143.0 lb

## 2020-10-12 DIAGNOSIS — G40209 Localization-related (focal) (partial) symptomatic epilepsy and epileptic syndromes with complex partial seizures, not intractable, without status epilepticus: Secondary | ICD-10-CM

## 2020-10-12 MED ORDER — LAMOTRIGINE ER 200 MG PO TB24: 2.0000 | ORAL_TABLET | Freq: Every day | ORAL | 4 refills | Status: DC

## 2020-10-12 NOTE — Patient Instructions (Signed)
Continue current medication for seizure prevention  Call for any seizures See you back in 1 year

## 2020-10-12 NOTE — Progress Notes (Signed)
PATIENT: [name] [name] DOB: 09/18/1950  REASON FOR VISIT: follow up HISTORY FROM: patient  HISTORY OF PRESENT ILLNESS: Today 10/12/20  HISTORY  Karly Pitter a 70 years old right-handed female, accompanied by her daughter [name], referred by her primary care nurse practitioner Jacqlyn Krauss for evaluation of seizure  She had a history of schizophrenia, hypertension, hyperlipidemia, type 2 diabetes, coronary artery disease, status post bypass surgery smoker  She started to have seizure around 1986, increased occurrence since 2000. She has average 1-3 seizure in one week, staring spells, drop her head, making noises, woke up confused, sometimes burn her face with cigarette stub, each episode lasts less than 5 minutes  Over the past few years, she has been taking Dilantin 300 mg every night, extra 30 mg tablet every Monday, Wednesday, Friday.  She denies gait difficulty, watches TV all day long,  I have reviewed MRI cardiac morphology in Nov 2014, left ventricle has normal size, with severely decreased ejection fraction 20-25%, hypokinesia is in the mid anteriorand anteroseptal walls. There is dyskinesis of the allapical four segments and in the true apex with a thrombus in theapex measuring 28 x 20 x 18 mm. There is a transmural scar in the apical septal and inferior walls and in the true apex. There is nontransmural scar in the apical anterior, mid antero and inferoseptal walls with good prognosis if revascularized. Right ventricle has normal size and function.There is mild mitral regurgitation and mild left atrial enlargement.  UPDATE July 26th 2016: She is with her daughter [name] today, MRI brain, EEG pending. She had 4 seizures in one week during transition from Dilantin to lamotrigine, Lamotrigine 100mg  bid works well for her, no significant side effect from lamotrigine, she only has one seizure in July 24 while taking lamotrigine 100 mg twice a day, daughter  concerned that compliance is the issue  UPDATE Mar 30 2015: I have reviewed MRI of the brain with without contrast, no significant abnormality, EEG was normal.  She has no recurrent seizure, currently taking lamotrigine xr 300 mg every night   UPDATE May 10th 2017: She is with her daughter at today's clinical visit, She is taking lamotrigine xr 300mg  qhs, has been compliant with her medication, she had two more seizure, in April 26th, and early April,   UPDATE Nov 15th 2017: She had 3 recurrent seizure, she had her thyroidectomy on Feb 21 2016, is on lamotrigine xr 200 mg 2 tablets every night, has been complying with her medications, level in May was 9.3,   Daughter reported that her seizure is undermuch bettercontrol, but she still had right hand scar from burning when she had a seizure few weeks ago,  UPDATE Mar 12 2017: She is here with her daughter June, and her grandson, she had one recurrent seizure September 27 2018after missing her lamotrigine xr 200mg  2 tab every night, she could not tolerate depakote ER 500mg  qhs, it made her very dizzy, she is no longer taking it  UPDATE July 15, 2018 SS: Ms. 10-15-1983 is a 70 year old female with history of recurrent complex partial seizures.  Her most recent seizure was in September 2018.  She is currently taking lamotrigine 200 mg extended release 2 tablets every night.  She is tolerating the medication well. She currently lives alone however her family is always with her.  She does not drive.  She is accompanied by her daughter at this visit.  She is able to care for herself however her daughter does prepare  her medications for the week.  In 2019, patient's mother and her aunt were killed in a car accident.  She denies any new problems or concerns today.  Her daughter is very pleased with the medication and the fact that the patient has not had a seizure in over a year.  She said prior to being on lamotrigine she would have daily  seizures.   Update July 22, 2019 SS: Here with her daughter today.  She has been doing well, has not had recurrent seizure.  She lives alone, but her family is always with her.  She does not drive.  She is tolerating lamotrigine XR 200 mg, 2 at bedtime.  Her daughter fixes her weekly pills.  Her overall health has been good.  No new problems or concerns.  Her daughter says she does not have schizophrenia.  Sees PCP for routine labs.  Last appointment, Lamictal level 7.9. BP 95/56, asymptomatic today.  Update Oct 12, 2020 SS: Here today for follow-up accompanied by her daughter, granddaughter.  Doing well, no recurrent seizure, remains on Lamictal XR 200 mg, 2 tablets at bedtime.  Last seizure was in 2018.  She is able to take her pills from the pillbox.  Lives alone but family stays with her. Has been having sensation she can't sneeze, has been to ER.  In March 2021 Lamictal level was 11.5,  REVIEW OF SYSTEMS: Out of a complete 14 system review of symptoms, the patient complains only of the following symptoms, and all other reviewed systems are negative.  Seizures   ALLERGIES: No Known Allergies  HOME MEDICATIONS: Outpatient Medications Prior to Visit  Medication Sig Dispense Refill  . acetaminophen (TYLENOL) 500 MG tablet Take 500 mg by mouth every 6 (six) hours as needed for mild pain.    Marland Kitchen. aspirin 81 MG tablet Take 81 mg by mouth daily.    Marland Kitchen. atorvastatin (LIPITOR) 80 MG tablet Take 80 mg by mouth daily.    . busPIRone (BUSPAR) 10 MG tablet Take 5 mg by mouth 2 (two) times daily. May take an additional 5mg  every 8 hours    . diphenhydramine-acetaminophen (TYLENOL PM) 25-500 MG TABS tablet Take 1 tablet by mouth at bedtime as needed (sleep).    . fluticasone (FLONASE) 50 MCG/ACT nasal spray Place 2 sprays into both nostrils daily. 16 g 0  . glyBURIDE-metformin (GLUCOVANCE) 5-500 MG per tablet Take 1 tablet by mouth 2 (two) times daily with a meal.    . lisinopril (PRINIVIL,ZESTRIL) 5 MG  tablet Take 5 mg by mouth daily.    . metoprolol succinate (TOPROL-XL) 25 MG 24 hr tablet Take 0.5 tablets (12.5 mg total) by mouth daily.    . nitroGLYCERIN (NITROSTAT) 0.4 MG SL tablet Place 0.4 mg under the tongue every 5 (five) minutes as needed for chest pain.    . LamoTRIgine 200 MG TB24 24 hour tablet TAKE 2 TABLETS BY MOUTH AT BEDTIME 180 tablet 0  . albuterol (PROVENTIL HFA;VENTOLIN HFA) 108 (90 Base) MCG/ACT inhaler Inhale 2 puffs into the lungs every 6 (six) hours as needed for wheezing or shortness of breath. 8 g 0  . levothyroxine (SYNTHROID, LEVOTHROID) 75 MCG tablet Take by mouth.     No facility-administered medications prior to visit.    PAST MEDICAL HISTORY: Past Medical History:  Diagnosis Date  . Acute thrombus of left ventricle (HCC)   . Epilepsy (HCC)   . High cholesterol   . Hypertension   . Ischemic cardiomyopathy   .  Memory deficits   . Schizo affective schizophrenia (HCC)   . Seizure disorder (HCC)   . Tobacco abuse   . Type II diabetes mellitus (HCC)     PAST SURGICAL HISTORY: Past Surgical History:  Procedure Laterality Date  . CORONARY ANGIOGRAM  04/03/2013   Procedure: CORONARY ANGIOGRAM;  Surgeon: Dolores Patty, MD;  Location: [facility] CATH LAB;  Service: Cardiovascular;;  . CORONARY ARTERY BYPASS GRAFT N/A 04/10/2013   Procedure: CORONARY ARTERY BYPASS GRAFTING (CABG);  Surgeon: Alleen Borne, MD;  Location: [facility] OR;  Service: Open Heart Surgery;  Laterality: N/A;  Times 4 using left internal mammary artery and endoscopically harvested right saphenous vein.  . INTRAOPERATIVE TRANSESOPHAGEAL ECHOCARDIOGRAM N/A 04/10/2013   Procedure: INTRAOPERATIVE TRANSESOPHAGEAL ECHOCARDIOGRAM;  Surgeon: Alleen Borne, MD;  Location: [facility] OR;  Service: Open Heart Surgery;  Laterality: N/A;  . RIGHT HEART CATHETERIZATION  04/03/2013   Procedure: RIGHT HEART CATH;  Surgeon: Dolores Patty, MD;  Location: [facility] CATH LAB;  Service: Cardiovascular;;  . THYROIDECTOMY   02/21/2016    FAMILY HISTORY: Family History  Problem Relation Age of Onset  . Thyroid disease Mother   . Liver disease Father   . Seizures Brother   . Heart disease Sister     SOCIAL HISTORY: Social History   Socioeconomic History  . Marital status: Single    Spouse name: Not on file  . Number of children: 3  . Years of education: 32  . Highest education level: Not on file  Occupational History  . Occupation: Unemployed  Tobacco Use  . Smoking status: Current Every Day Smoker    Packs/day: 2.00    Types: Cigarettes    Last attempt to quit: 04/19/2013    Years since quitting: 7.4  . Smokeless tobacco: Never Used  Substance and Sexual Activity  . Alcohol use: No    Alcohol/week: 0.0 standard drinks  . Drug use: No  . Sexual activity: Not on file  Other Topics Concern  . Not on file  Social History Narrative   Lives at home alone.   Right-handed.   Approximately two 2 liters per day of soda.   Social Determinants of Health   Financial Resource Strain: Not on file  Food Insecurity: Not on file  Transportation Needs: Not on file  Physical Activity: Not on file  Stress: Not on file  Social Connections: Not on file  Intimate Partner Violence: Not on file   PHYSICAL EXAM  Vitals:   10/12/20 1407  BP: 110/64  Pulse: 62  Weight: 143 lb (64.9 kg)  Height: 5\' 3"  (1.6 m)   Body mass index is 25.33 kg/m.  Generalized: Well developed, in no acute distress, repeat BP 95/56 Neurological examination  Mentation: Alert oriented to time, place. Follows all commands speech and language fluent.  Most of history is provided by daughter, flat affect. Cranial nerve II-XII: Pupils were equal round reactive to light. Extraocular movements were full, visual field were full on confrontational test. Facial sensation and strength were normal. Head turning and shoulder shrug were normal and symmetric. Motor: Good strength of all extremities Sensory: Sensory testing is intact to  soft touch on all 4 extremities. No evidence of extinction is noted.  Coordination: Cerebellar testing reveals good finger-nose-finger and heel-to-shin bilaterally. Mild difficulty understanding exam commands Gait and station:  gait is normal, no assistive device, steady Reflexes: Deep tendon reflexes are symmetric, 2+  DIAGNOSTIC DATA (LABS, IMAGING, TESTING) - I reviewed patient records, labs, notes, testing  and imaging myself where available.  Lab Results  Component Value Date   WBC 5.2 07/16/2016   HGB 11.8 (L) 07/16/2016   HCT 36.0 07/16/2016   MCV 96.0 07/16/2016   PLT 289 07/16/2016      Component Value Date/Time   NA 138 07/16/2016 1216   NA 140 10/21/2014 1009   K 4.2 07/16/2016 1216   CL 105 07/16/2016 1216   CO2 23 07/16/2016 1216   GLUCOSE 167 (H) 07/16/2016 1216   BUN <5 (L) 07/16/2016 1216   BUN 10 10/21/2014 1009   CREATININE 0.85 07/16/2016 1216   CALCIUM 9.4 07/16/2016 1216   PROT 7.1 10/21/2014 1009   ALBUMIN 4.3 10/21/2014 1009   AST 27 10/21/2014 1009   ALT 28 10/21/2014 1009   ALKPHOS 142 (H) 10/21/2014 1009   BILITOT 0.2 10/21/2014 1009   GFRNONAA >60 07/16/2016 1216   GFRAA >60 07/16/2016 1216   Lab Results  Component Value Date   CHOL 107 07/16/2016   HDL 34 (L) 07/16/2016   LDLCALC 53 07/16/2016   TRIG 101 07/16/2016   CHOLHDL 3.1 07/16/2016   Lab Results  Component Value Date   HGBA1C 11.9 (H) 04/03/2013   No results found for: [id number] Lab Results  Component Value Date   TSH 0.583 10/21/2014   ASSESSMENT AND PLAN 70 y.o. year old female  has a past medical history of Acute thrombus of left ventricle (HCC), Epilepsy (HCC), High cholesterol, Hypertension, Ischemic cardiomyopathy, Memory deficits, Schizo affective schizophrenia (HCC), Seizure disorder (HCC), Tobacco abuse, and Type II diabetes mellitus (HCC). here with:  1.  Complex partial seizure -Doing well, no recurrent seizure -Last seizure was in September 2018 -Lamictal  level in March 2021 was 11.5, tolerating well -Continue lamotrigine 200 mg XR, 2 tablets at bedtime -Call for recurrent seizure, otherwise follow-up 1 year or sooner if needed  Otila Kluver, DNP 10/12/2020, 2:37 PM [facility] [address], [address] [place], [place] [address] [phone]

## 2021-10-12 ENCOUNTER — Ambulatory Visit: Payer: Medicare Other | Admitting: Neurology

## 2021-10-24 ENCOUNTER — Ambulatory Visit (INDEPENDENT_AMBULATORY_CARE_PROVIDER_SITE_OTHER): Payer: Medicare Other | Admitting: Neurology

## 2021-10-24 ENCOUNTER — Encounter: Payer: Self-pay | Admitting: Neurology

## 2021-10-24 VITALS — BP 125/65 | HR 52 | Ht 63.0 in | Wt 133.5 lb

## 2021-10-24 DIAGNOSIS — G40209 Localization-related (focal) (partial) symptomatic epilepsy and epileptic syndromes with complex partial seizures, not intractable, without status epilepticus: Secondary | ICD-10-CM

## 2021-10-24 MED ORDER — LAMOTRIGINE ER 200 MG PO TB24: 2.0000 | ORAL_TABLET | Freq: Every day | ORAL | 4 refills | Status: DC

## 2021-10-24 NOTE — Progress Notes (Signed)
PATIENT: [name] [name] DOB: Oct 10, 1950  REASON FOR VISIT: follow up for seizures HISTORY FROM: patient, grandson PRIMARY NEUROLOGIST: Dr. Terrace Arabia   HISTORY  [name] is a 71 years old right-handed female, accompanied by her daughter [name], referred by her primary care nurse practitioner Jacqlyn Krauss for evaluation of seizure   She had a history of schizophrenia, hypertension, hyperlipidemia, type 2 diabetes, coronary artery disease, status post bypass surgery smoker   She started to have seizure around 1986, increased occurrence since 2000. She has average 1-3 seizure in one week, staring spells, drop her head, making noises, woke up confused, sometimes burn her face with cigarette stub, each episode lasts less than 5 minutes   Over the past few years, she has been taking Dilantin 300 mg every night, extra 30 mg tablet every Monday, Wednesday, Friday.   She denies gait difficulty, watches TV all day long,   I have reviewed MRI cardiac morphology in Nov 2014, left ventricle has normal size, with severely decreased ejection fraction 20-25%, hypokinesia is in the mid anterior and anteroseptal walls. There is dyskinesis of the all apical four segments and in the true apex with a thrombus in the apex measuring 28 x 20 x 18 mm. There is a transmural scar in the apical septal and inferior walls and in the true apex. There is nontransmural scar in the apical anterior, mid antero and inferoseptal walls with good prognosis if revascularized. Right ventricle has normal size and function.There is mild mitral regurgitation and mild left atrial enlargement.   UPDATE July 26th 2016: She is with her daughter [name] today, MRI brain, EEG pending. She had 4 seizures in one week during transition from Dilantin to lamotrigine, Lamotrigine 100mg  bid works well for her, no significant side effect from lamotrigine, she only has one seizure in July 24 while taking lamotrigine 100 mg twice a day, daughter  concerned that compliance is the issue   UPDATE Mar 30 2015: I have reviewed MRI of the brain with without contrast, no significant abnormality, EEG was normal.   She has no recurrent seizure, currently taking lamotrigine xr 300 mg every night    UPDATE May 10th 2017: She is with her daughter at today's clinical visit, She is taking lamotrigine xr 300mg  qhs, has been compliant with her medication, she had two more seizure, in April 26th, and early April,     UPDATE Nov 15th 2017: She had 3 recurrent seizure, she had her thyroidectomy on Feb 21 2016, is on lamotrigine xr 200 mg 2 tablets every night, has been complying with her medications, level in May was 9.3,    Daughter reported that her seizure is under much better control, but she still had right hand scar from burning when she had a seizure few weeks ago,   UPDATE Mar 12 2017: She is here with her daughter June, and her grandson, she had one recurrent seizure February 14 2017 after missing her lamotrigine xr 200mg  2 tab every night, she could not tolerate depakote ER 500mg  qhs, it made her very dizzy, she is no longer taking it   UPDATE July 15, 2018 SS: Ms. 10-15-1983 is a 71 year old female with history of recurrent complex partial seizures.  Her most recent seizure was in September 2018.  She is currently taking lamotrigine 200 mg extended release 2 tablets every night.  She is tolerating the medication well. She currently lives alone however her family is always with her.  She does not drive.  She is accompanied by her daughter at this visit.  She is able to care for herself however her daughter does prepare her medications for the week.  In 2019, patient's mother and her aunt were killed in a car accident.  She denies any new problems or concerns today.  Her daughter is very pleased with the medication and the fact that the patient has not had a seizure in over a year.  She said prior to being on lamotrigine she would have daily  seizures.   Update July 22, 2019 SS: Here with her daughter today.  She has been doing well, has not had recurrent seizure.  She lives alone, but her family is always with her.  She does not drive.  She is tolerating lamotrigine XR 200 mg, 2 at bedtime.  Her daughter fixes her weekly pills.  Her overall health has been good.  No new problems or concerns.  Her daughter says she does not have schizophrenia.  Sees PCP for routine labs.  Last appointment, Lamictal level 7.9. BP 95/56, asymptomatic today.  Update Oct 12, 2020 SS: Here today for follow-up accompanied by her daughter, granddaughter.  Doing well, no recurrent seizure, remains on Lamictal XR 200 mg, 2 tablets at bedtime.  Last seizure was in 2018.  She is able to take her pills from the pillbox.  Lives alone but family stays with her. Has been having sensation she can't sneeze, has been to ER.  In March 2021 Lamictal level was 11.5,  Update October 24, [name]: Here with grandson, no seizures, remains on Lamictal XR 200 mg, 2 tablets at bedtime, she takes from pill box. Lives alone, but someone always with her. Working on eating better, drinking more water, takes gabapentin for sciatica, seeing spine doctor at [facility]. No falls, but has to hold on to rails. Smokes 2 packs a day.   CBC unremarkable 10/17/21, BMP creatinine 1.20, normal liver function 08/03/21 CMP  REVIEW OF SYSTEMS: Out of a complete 14 system review of symptoms, the patient complains only of the following symptoms, and all other reviewed systems are negative.  See HPI  ALLERGIES: No Known Allergies  HOME MEDICATIONS: Outpatient Medications Prior to Visit  Medication Sig Dispense Refill   acetaminophen (TYLENOL) 500 MG tablet Take 500 mg by mouth every 6 (six) hours as needed for mild pain.     aspirin 81 MG tablet Take 81 mg by mouth daily.     atorvastatin (LIPITOR) 80 MG tablet Take 80 mg by mouth daily.     busPIRone (BUSPAR) 10 MG tablet Take 5 mg by mouth 2 (two)  times daily. May take an additional 5mg  every 8 hours     diphenhydramine-acetaminophen (TYLENOL PM) 25-500 MG TABS tablet Take 1 tablet by mouth at bedtime as needed (sleep).     fluticasone (FLONASE) 50 MCG/ACT nasal spray Place 2 sprays into both nostrils daily. 16 g 0   gabapentin (NEURONTIN) 300 MG capsule Take 1 capsule by mouth at bedtime.     glipiZIDE (GLUCOTROL XL) 5 MG 24 hr tablet Take 5 mg by mouth daily.     glyBURIDE-metformin (GLUCOVANCE) 5-500 MG per tablet Take 1 tablet by mouth 2 (two) times daily with a meal.     lisinopril (PRINIVIL,ZESTRIL) 5 MG tablet Take 5 mg by mouth daily.     metoprolol succinate (TOPROL-XL) 25 MG 24 hr tablet Take 0.5 tablets (12.5 mg total) by mouth daily.     nitroGLYCERIN (NITROSTAT) 0.4 MG  SL tablet Place 0.4 mg under the tongue every 5 (five) minutes as needed for chest pain.     omeprazole (PRILOSEC) 40 MG capsule Take 40 mg by mouth every morning.     LamoTRIgine 200 MG TB24 24 hour tablet Take 2 tablets (400 mg total) by mouth at bedtime. 180 tablet 4   albuterol (PROVENTIL HFA;VENTOLIN HFA) 108 (90 Base) MCG/ACT inhaler Inhale 2 puffs into the lungs every 6 (six) hours as needed for wheezing or shortness of breath. 8 g 0   levothyroxine (SYNTHROID, LEVOTHROID) 75 MCG tablet Take by mouth.     No facility-administered medications prior to visit.    PAST MEDICAL HISTORY: Past Medical History:  Diagnosis Date   Acute thrombus of left ventricle (HCC)    Epilepsy (HCC)    High cholesterol    Hypertension    Ischemic cardiomyopathy    Memory deficits    Schizo affective schizophrenia (HCC)    Seizure disorder (HCC)    Tobacco abuse    Type II diabetes mellitus (HCC)     PAST SURGICAL HISTORY: Past Surgical History:  Procedure Laterality Date   CORONARY ANGIOGRAM  04/03/2013   Procedure: CORONARY ANGIOGRAM;  Surgeon: Dolores Patty, MD;  Location: [facility] CATH LAB;  Service: Cardiovascular;;   CORONARY ARTERY BYPASS GRAFT N/A  04/10/2013   Procedure: CORONARY ARTERY BYPASS GRAFTING (CABG);  Surgeon: Alleen Borne, MD;  Location: [facility] OR;  Service: Open Heart Surgery;  Laterality: N/A;  Times 4 using left internal mammary artery and endoscopically harvested right saphenous vein.   INTRAOPERATIVE TRANSESOPHAGEAL ECHOCARDIOGRAM N/A 04/10/2013   Procedure: INTRAOPERATIVE TRANSESOPHAGEAL ECHOCARDIOGRAM;  Surgeon: Alleen Borne, MD;  Location: [facility] OR;  Service: Open Heart Surgery;  Laterality: N/A;   RIGHT HEART CATHETERIZATION  04/03/2013   Procedure: RIGHT HEART CATH;  Surgeon: Dolores Patty, MD;  Location: [facility] CATH LAB;  Service: Cardiovascular;;   THYROIDECTOMY  02/21/2016    FAMILY HISTORY: Family History  Problem Relation Age of Onset   Thyroid disease Mother    Liver disease Father    Seizures Brother    Heart disease Sister     SOCIAL HISTORY: Social History   Socioeconomic History   Marital status: Single    Spouse name: Not on file   Number of children: 3   Years of education: 11   Highest education level: Not on file  Occupational History   Occupation: Unemployed  Tobacco Use   Smoking status: Every Day    Packs/day: 2.00    Types: Cigarettes    Last attempt to quit: 04/19/2013    Years since quitting: 8.5   Smokeless tobacco: Never  Substance and Sexual Activity   Alcohol use: No    Alcohol/week: 0.0 standard drinks   Drug use: No   Sexual activity: Not on file  Other Topics Concern   Not on file  Social History Narrative   Lives at home alone.   Right-handed.   Approximately two 2 liters per day of soda.   Social Determinants of Health   Financial Resource Strain: Not on file  Food Insecurity: Not on file  Transportation Needs: Not on file  Physical Activity: Not on file  Stress: Not on file  Social Connections: Not on file  Intimate Partner Violence: Not on file   PHYSICAL EXAM  Vitals:   10/24/21 1440  BP: 125/65  Pulse: (!) 52  Weight: 133 lb 8 oz (60.6 kg)   Height: 5\' 3"  (1.6 m)  Body mass index is 23.65 kg/m.  Generalized: Well developed, in no acute distress, tired female Neurological examination  Mentation: Alert oriented to time, place. Follows all commands speech and language fluent.  Most of history is provided by grandson, flat affect Cranial nerve II-XII: Pupils were equal round reactive to light. Extraocular movements were full, visual field were full on confrontational test. Facial sensation and strength were normal. Head turning and shoulder shrug were normal and symmetric. Motor: Good strength of all extremities Sensory: Sensory testing is intact to soft touch on all 4 extremities. No evidence of extinction is noted.  Coordination: Cerebellar testing reveals good finger-nose-finger and heel-to-shin bilaterally. Mild difficulty understanding exam commands Gait and station:  gait is normal, no assistive device, steady Reflexes: Deep tendon reflexes are symmetric, 2+  DIAGNOSTIC DATA (LABS, IMAGING, TESTING) - I reviewed patient records, labs, notes, testing and imaging myself where available.  Lab Results  Component Value Date   WBC 5.2 07/16/2016   HGB 11.8 (L) 07/16/2016   HCT 36.0 07/16/2016   MCV 96.0 07/16/2016   PLT 289 07/16/2016      Component Value Date/Time   NA 138 07/16/2016 1216   NA 140 10/21/2014 1009   K 4.2 07/16/2016 1216   CL 105 07/16/2016 1216   CO2 23 07/16/2016 1216   GLUCOSE 167 (H) 07/16/2016 1216   BUN <5 (L) 07/16/2016 1216   BUN 10 10/21/2014 1009   CREATININE 0.85 07/16/2016 1216   CALCIUM 9.4 07/16/2016 1216   PROT 7.1 10/21/2014 1009   ALBUMIN 4.3 10/21/2014 1009   AST 27 10/21/2014 1009   ALT 28 10/21/2014 1009   ALKPHOS 142 (H) 10/21/2014 1009   BILITOT 0.2 10/21/2014 1009   GFRNONAA >60 07/16/2016 1216   GFRAA >60 07/16/2016 1216   Lab Results  Component Value Date   CHOL 107 07/16/2016   HDL 34 (L) 07/16/2016   LDLCALC 53 07/16/2016   TRIG 101 07/16/2016   CHOLHDL 3.1  07/16/2016   Lab Results  Component Value Date   HGBA1C 11.9 (H) 04/03/2013   No results found for: [id number] Lab Results  Component Value Date   TSH 0.583 10/21/2014   ASSESSMENT AND PLAN 70 y.o. year old female :  1.  Complex partial seizure -No recent seizures, last was in September 2018 -Reviewed recent routine labs, check Lamictal level today -Continue lamotrigine XR 200 mg, 2 tablets at bedtime -Call for recurrent seizure, otherwise follow-up 1 year or sooner if needed  Margie Ege, Edrick Oh, DNP 10/24/2021, 3:13 PM [facility] [address] [place], [place] [address] [phone]

## 2021-10-24 NOTE — Patient Instructions (Signed)
Great to see you today Continue the Lamictal  Check level today Call for any seizures

## 2021-10-26 ENCOUNTER — Telehealth: Payer: Self-pay | Admitting: *Deleted

## 2021-10-26 LAB — LAMOTRIGINE LEVEL: Lamotrigine Lvl: 7.4 ug/mL (ref 2.0–20.0)

## 2021-10-26 NOTE — Telephone Encounter (Signed)
I spoke her daughter, Denman George, on [place]. She is aware of the lab results and will relay to her mother.

## 2021-10-26 NOTE — Telephone Encounter (Signed)
-----   Message from Glean Salvo, NP sent at 10/26/2021  5:45 AM EDT ----- Please call, Lamictal level is therapeutic, continue with current dosing.

## 2022-10-25 ENCOUNTER — Ambulatory Visit: Payer: 59 | Admitting: Neurology

## 2022-12-17 ENCOUNTER — Ambulatory Visit (INDEPENDENT_AMBULATORY_CARE_PROVIDER_SITE_OTHER): Payer: 59 | Admitting: Neurology

## 2022-12-17 ENCOUNTER — Encounter: Payer: Self-pay | Admitting: Neurology

## 2022-12-17 VITALS — BP 118/70 | HR 58 | Ht 63.0 in | Wt 145.5 lb

## 2022-12-17 DIAGNOSIS — G40909 Epilepsy, unspecified, not intractable, without status epilepticus: Secondary | ICD-10-CM

## 2022-12-17 MED ORDER — LAMOTRIGINE ER 200 MG PO TB24: 2.0000 | ORAL_TABLET | Freq: Every day | ORAL | 4 refills | Status: DC

## 2022-12-17 NOTE — Patient Instructions (Signed)
I am glad you are doing well.  Happy late 72th birthday!!  We will continue Lamictal for seizure prevention.  Please call for any seizures.  I will see you back in 1 year.  Thanks!!

## 2022-12-17 NOTE — Progress Notes (Signed)
PATIENT: [name] [name] DOB: 06-18-1950  REASON FOR VISIT: follow up for seizures HISTORY FROM: patient, grandson PRIMARY NEUROLOGIST: Dr. Terrace Arabia   HISTORY  [name] [name] is a 72 years old right-handed female, accompanied by her daughter [name] [name], referred by her primary care nurse practitioner [name] [name] for evaluation of seizure   She had a history of schizophrenia, hypertension, hyperlipidemia, type 2 diabetes, coronary artery disease, status post bypass surgery smoker   She started to have seizure around 1986, increased occurrence since 2000. She has average 1-3 seizure in one week, staring spells, drop her head, making noises, woke up confused, sometimes burn her face with cigarette stub, each episode lasts less than 5 minutes   Over the past few years, she has been taking Dilantin 300 mg every night, extra 30 mg tablet every Monday, Wednesday, Friday.   She denies gait difficulty, watches TV all day long,   I have reviewed MRI cardiac morphology in Nov 2014, left ventricle has normal size, with severely decreased ejection fraction 20-25%, hypokinesia is in the mid anterior and anteroseptal walls. There is dyskinesis of the all apical four segments and in the true apex with a thrombus in the apex measuring 28 x 20 x 18 mm. There is a transmural scar in the apical septal and inferior walls and in the true apex. There is nontransmural scar in the apical anterior, mid antero and inferoseptal walls with good prognosis if revascularized. Right ventricle has normal size and function.There is mild mitral regurgitation and mild left atrial enlargement.   UPDATE July 26th 2016: She is with her daughter [name] [name] today, MRI brain, EEG pending. She had 4 seizures in one week during transition from Dilantin to lamotrigine, Lamotrigine 100mg  bid works well for her, no significant side effect from lamotrigine, she only has one seizure in July 24 while taking lamotrigine 100 mg twice a day, daughter  concerned that compliance is the issue   UPDATE Mar 30 2015: I have reviewed MRI of the brain with without contrast, no significant abnormality, EEG was normal.   She has no recurrent seizure, currently taking lamotrigine xr 300 mg every night    UPDATE May 10th 2017: She is with her daughter at today's clinical visit, She is taking lamotrigine xr 300mg  qhs, has been compliant with her medication, she had two more seizure, in April 26th, and early April,     UPDATE Nov 15th 2017: She had 3 recurrent seizure, she had her thyroidectomy on Feb 21 2016, is on lamotrigine xr 200 mg 2 tablets every night, has been complying with her medications, level in May was 9.3,    Daughter reported that her seizure is under much better control, but she still had right hand scar from burning when she had a seizure few weeks ago,   UPDATE Mar 12 2017: She is here with her daughter [name] [name], and her grandson, she had one recurrent seizure February 14 2017 after missing her lamotrigine xr 200mg  2 tab every night, she could not tolerate depakote ER 500mg  qhs, it made her very dizzy, she is no longer taking it   UPDATE July 15, 2018 SS: Ms. [name] [name] is a 72 year old female with history of recurrent complex partial seizures.  Her most recent seizure was in September 2018.  She is currently taking lamotrigine 200 mg extended release 2 tablets every night.  She is tolerating the medication well. She currently lives alone however her family is always with her.  She does not drive.  She is accompanied by her daughter at this visit.  She is able to care for herself however her daughter does prepare her medications for the week.  In 2019, patient's mother and her aunt were killed in a car accident.  She denies any new problems or concerns today.  Her daughter is very pleased with the medication and the fact that the patient has not had a seizure in over a year.  She said prior to being on lamotrigine she would have daily  seizures.   Update July 22, 2019 SS: Here with her daughter today.  She has been doing well, has not had recurrent seizure.  She lives alone, but her family is always with her.  She does not drive.  She is tolerating lamotrigine XR 200 mg, 2 at bedtime.  Her daughter fixes her weekly pills.  Her overall health has been good.  No new problems or concerns.  Her daughter says she does not have schizophrenia.  Sees PCP for routine labs.  Last appointment, Lamictal level 7.9. BP 95/56, asymptomatic today.  Update Oct 12, 2020 SS: Here today for follow-up accompanied by her daughter, granddaughter.  Doing well, no recurrent seizure, remains on Lamictal XR 200 mg, 2 tablets at bedtime.  Last seizure was in 2018.  She is able to take her pills from the pillbox.  Lives alone but family stays with her. Has been having sensation she can't sneeze, has been to ER.  In March 2021 Lamictal level was 11.5,  Update October 24, [name]: Here with grandson, no seizures, remains on Lamictal XR 200 mg, 2 tablets at bedtime, she takes from pill box. Lives alone, but someone always with her. Working on eating better, drinking more water, takes gabapentin for sciatica, seeing spine doctor at [facility]. No falls, but has to hold on to rails. Smokes 2 packs a day.   CBC unremarkable 10/17/21, BMP creatinine 1.20, normal liver function 08/03/21 CMP  Update December 17, 2022 SS: June 2023 Lamictal level 7.4. here with grandson. Issues with GYN issues, no seizures. Has her own house, but her grandkids stay. Remains on Lamictal XR 200 mg 2 pills at bedtime. Medications are overseen. Getting around fine, no falls, she doesn't drive.  REVIEW OF SYSTEMS: Out of a complete 14 system review of symptoms, the patient complains only of the following symptoms, and all other reviewed systems are negative.  See HPI  ALLERGIES: No Known Allergies  HOME MEDICATIONS: Outpatient Medications Prior to Visit  Medication Sig Dispense Refill    acetaminophen (TYLENOL) 500 MG tablet Take 500 mg by mouth every 6 (six) hours as needed for mild pain.     albuterol (PROVENTIL HFA;VENTOLIN HFA) 108 (90 Base) MCG/ACT inhaler Inhale 2 puffs into the lungs every 6 (six) hours as needed for wheezing or shortness of breath. 8 g 0   aspirin 81 MG tablet Take 81 mg by mouth daily.     atorvastatin (LIPITOR) 80 MG tablet Take 80 mg by mouth daily.     diphenhydramine-acetaminophen (TYLENOL PM) 25-500 MG TABS tablet Take 1 tablet by mouth at bedtime as needed (sleep).     fluticasone (FLONASE) 50 MCG/ACT nasal spray Place 2 sprays into both nostrils daily. 16 g 0   gabapentin (NEURONTIN) 300 MG capsule Take 1 capsule by mouth at bedtime.     glipiZIDE (GLUCOTROL XL) 5 MG 24 hr tablet Take 5 mg by mouth daily.     glyBURIDE-metformin (GLUCOVANCE) 5-500 MG per tablet Take  1 tablet by mouth 2 (two) times daily with a meal.     levothyroxine (SYNTHROID, LEVOTHROID) 75 MCG tablet Take by mouth.     lisinopril (PRINIVIL,ZESTRIL) 5 MG tablet Take 2.5 mg by mouth daily.     metoprolol succinate (TOPROL-XL) 25 MG 24 hr tablet Take 0.5 tablets (12.5 mg total) by mouth daily.     nitroGLYCERIN (NITROSTAT) 0.4 MG SL tablet Place 0.4 mg under the tongue every 5 (five) minutes as needed for chest pain.     omeprazole (PRILOSEC) 40 MG capsule Take 40 mg by mouth every morning.     LamoTRIgine 200 MG TB24 24 hour tablet Take 2 tablets (400 mg total) by mouth at bedtime. 180 tablet 4   busPIRone (BUSPAR) 10 MG tablet Take 5 mg by mouth 2 (two) times daily. May take an additional 5mg  every 8 hours (Patient not taking: Reported on 12/17/2022)     No facility-administered medications prior to visit.    PAST MEDICAL HISTORY: Past Medical History:  Diagnosis Date   Acute thrombus of left ventricle (HCC)    Epilepsy (HCC)    High cholesterol    Hypertension    Ischemic cardiomyopathy    Memory deficits    Schizo affective schizophrenia (HCC)    Seizure disorder  (HCC)    Tobacco abuse    Type II diabetes mellitus (HCC)     PAST SURGICAL HISTORY: Past Surgical History:  Procedure Laterality Date   CORONARY ANGIOGRAM  04/03/2013   Procedure: CORONARY ANGIOGRAM;  Surgeon: Dolores Patty, MD;  Location: [facility] CATH LAB;  Service: Cardiovascular;;   CORONARY ARTERY BYPASS GRAFT N/A 04/10/2013   Procedure: CORONARY ARTERY BYPASS GRAFTING (CABG);  Surgeon: Alleen Borne, MD;  Location: [facility] OR;  Service: Open Heart Surgery;  Laterality: N/A;  Times 4 using left internal mammary artery and endoscopically harvested right saphenous vein.   INTRAOPERATIVE TRANSESOPHAGEAL ECHOCARDIOGRAM N/A 04/10/2013   Procedure: INTRAOPERATIVE TRANSESOPHAGEAL ECHOCARDIOGRAM;  Surgeon: Alleen Borne, MD;  Location: [facility] OR;  Service: Open Heart Surgery;  Laterality: N/A;   RIGHT HEART CATHETERIZATION  04/03/2013   Procedure: RIGHT HEART CATH;  Surgeon: Dolores Patty, MD;  Location: [facility] CATH LAB;  Service: Cardiovascular;;   THYROIDECTOMY  02/21/2016    FAMILY HISTORY: Family History  Problem Relation Age of Onset   Thyroid disease Mother    Liver disease Father    Seizures Brother    Heart disease Sister     SOCIAL HISTORY: Social History   Socioeconomic History   Marital status: Single    Spouse name: Not on file   Number of children: 3   Years of education: 11   Highest education level: Not on file  Occupational History   Occupation: Unemployed  Tobacco Use   Smoking status: Every Day    Current packs/day: 0.00    Types: Cigarettes    Last attempt to quit: 04/19/2013    Years since quitting: 9.6   Smokeless tobacco: Never  Substance and Sexual Activity   Alcohol use: No    Alcohol/week: 0.0 standard drinks of alcohol   Drug use: No   Sexual activity: Not on file  Other Topics Concern   Not on file  Social History Narrative   Lives at home alone.   Right-handed.   Approximately two 2 liters per day of soda.   Social Determinants of Health    Financial Resource Strain: Low Risk  (01/09/2022)   Received from [facility], [facility]   Overall Financial Resource Strain (CARDIA)    Difficulty of Paying Living Expenses: Not hard at all  Food Insecurity: No Food Insecurity (01/09/2022)   Received from [facility], [facility]   Hunger Vital Sign    Worried About Running Out of Food in the Last Year: Never true    Ran Out of Food in the Last Year: Never true  Transportation Needs: No Transportation Needs (01/09/2022)   Received from [facility], [facility]   [facility] - Transportation    Lack of Transportation (Medical): No    Lack of Transportation (Non-Medical): No  Physical Activity: Insufficiently Active (01/09/2022)   Received from [facility], [facility]   Exercise Vital Sign    Days of Exercise per Week: 2 days    Minutes of Exercise per Session: 50 min  Stress: Stress Concern Present (01/09/2022)   Received from [facility], [facility] - Occupational Stress Questionnaire    Feeling of Stress : To some extent  Social Connections: Moderately Integrated (01/09/2022)   Received from [facility], [facility]   Social Connection and Isolation Panel [NHANES]    Frequency of Communication with Friends and Family: More than three times a week    Frequency of Social Gatherings with Friends and Family: More than three times a week    Attends Religious Services: More than 4 times per year    Active Member of [employer] or Organizations: Yes    Attends Banker Meetings: More than 4 times per year    Marital Status: Never married  Intimate Partner Violence: Not At Risk (01/09/2022)   Received from [facility], [facility]   Humiliation, Afraid, Rape, and Kick questionnaire    Fear of Current or Ex-Partner: No    Emotionally Abused: No    Physically Abused: No    Sexually Abused: No   PHYSICAL EXAM  Vitals:   12/17/22  0833  BP: 118/70  Pulse: (!) 58  Weight: 145 lb 8 oz (66 kg)  Height: 5\' 3"  (1.6 m)   Body mass index is 25.77 kg/m.  Generalized: Well developed, in no acute distress Neurological examination  Mentation: Alert oriented to time, place. Follows all commands speech and language fluent.  Most of history is provided by grandson, flat affect Cranial nerve II-XII: Pupils were equal round reactive to light. Extraocular movements were full, visual field were full on confrontational test. Facial sensation and strength were normal. Head turning and shoulder shrug were normal and symmetric. Motor: Good strength of all extremities Sensory: Sensory testing is intact to soft touch on all 4 extremities. No evidence of extinction is noted.  Coordination: Cerebellar testing reveals good finger-nose-finger and heel-to-shin bilaterally. Mild difficulty understanding exam commands Gait and station:  gait is normal, no assistive device, steady Reflexes: Deep tendon reflexes are symmetric, 2+  DIAGNOSTIC DATA (LABS, IMAGING, TESTING) - I reviewed patient records, labs, notes, testing and imaging myself where available.  Lab Results  Component Value Date   WBC 5.2 07/16/2016   HGB 11.8 (L) 07/16/2016   HCT 36.0 07/16/2016   MCV 96.0 07/16/2016   PLT 289 07/16/2016      Component Value Date/Time   NA 138 07/16/2016 1216   NA 140 10/21/2014 1009   K 4.2 07/16/2016 1216   CL 105 07/16/2016 1216   CO2 23 07/16/2016 1216   GLUCOSE  167 (H) 07/16/2016 1216   BUN <5 (L) 07/16/2016 1216   BUN 10 10/21/2014 1009   CREATININE 0.85 07/16/2016 1216   CALCIUM 9.4 07/16/2016 1216   PROT 7.1 10/21/2014 1009   ALBUMIN 4.3 10/21/2014 1009   AST 27 10/21/2014 1009   ALT 28 10/21/2014 1009   ALKPHOS 142 (H) 10/21/2014 1009   BILITOT 0.2 10/21/2014 1009   GFRNONAA >60 07/16/2016 1216   GFRAA >60 07/16/2016 1216   Lab Results  Component Value Date   CHOL 107 07/16/2016   HDL 34 (L) 07/16/2016   LDLCALC 53  07/16/2016   TRIG 101 07/16/2016   CHOLHDL 3.1 07/16/2016   Lab Results  Component Value Date   HGBA1C 11.9 (H) 04/03/2013   No results found for: "VITAMINB12" Lab Results  Component Value Date   TSH 0.583 10/21/2014   ASSESSMENT AND PLAN 72 y.o. year old female :  1.  Complex partial seizure -No recent seizures, last was in September 2018 -Continue lamotrigine XR 200 mg, 2 tablets at bedtime -Call for recurrent seizure, otherwise follow-up 1 year or sooner if needed  [name] Kluver, DNP 12/17/2022, 8:55 AM [facility] [address] [place], [place] [address] [phone]

## 2023-07-01 ENCOUNTER — Other Ambulatory Visit: Payer: Self-pay | Admitting: Anesthesiology

## 2023-07-01 MED ORDER — LAMOTRIGINE ER 200 MG PO TB24: 2.0000 | ORAL_TABLET | Freq: Every day | ORAL | 3 refills | Status: DC

## 2023-12-17 ENCOUNTER — Ambulatory Visit: Payer: 59 | Admitting: Neurology

## 2023-12-17 ENCOUNTER — Telehealth: Payer: Self-pay | Admitting: Neurology

## 2023-12-17 NOTE — Telephone Encounter (Signed)
 Pt son called to cancel appt due to Pt being sick  Appt rescheduled

## 2023-12-17 NOTE — Progress Notes (Deleted)
 PATIENT: [name] [name] DOB: 03/10/1951  REASON FOR VISIT: follow up for seizures HISTORY FROM: patient, grandson PRIMARY NEUROLOGIST: Dr. Onita   HISTORY  [name] [name] is a 73 years old right-handed female, accompanied by her daughter [name] [name], referred by her primary care nurse practitioner [name] [name] for evaluation of seizure   She had a history of schizophrenia, hypertension, hyperlipidemia, type 2 diabetes, coronary artery disease, status post bypass surgery smoker   She started to have seizure around 1986, increased occurrence since 2000. She has average 1-3 seizure in one week, staring spells, drop her head, making noises, woke up confused, sometimes burn her face with cigarette stub, each episode lasts less than 5 minutes   Over the past few years, she has been taking Dilantin  300 mg every night, extra 30 mg tablet every Monday, Wednesday, Friday.   She denies gait difficulty, watches TV all day long,   I have reviewed MRI cardiac morphology in Nov 2014, left ventricle has normal size, with severely decreased ejection fraction 20-25%, hypokinesia is in the mid anterior and anteroseptal walls. There is dyskinesis of the all apical four segments and in the true apex with a thrombus in the apex measuring 28 x 20 x 18 mm. There is a transmural scar in the apical septal and inferior walls and in the true apex. There is nontransmural scar in the apical anterior, mid antero and inferoseptal walls with good prognosis if revascularized. Right ventricle has normal size and function.There is mild mitral regurgitation and mild left atrial enlargement.   UPDATE July 26th 2016: She is with her daughter [name] [name] today, MRI brain, EEG pending. She had 4 seizures in one week during transition from Dilantin  to lamotrigine , Lamotrigine  100mg  bid works well for her, no significant side effect from lamotrigine , she only has one seizure in July 24 while taking lamotrigine  100 mg twice a day, daughter  concerned that compliance is the issue   UPDATE Mar 30 2015: I have reviewed MRI of the brain with without contrast, no significant abnormality, EEG was normal.   She has no recurrent seizure, currently taking lamotrigine  xr 300 mg every night    UPDATE May 10th 2017: She is with her daughter at today's clinical visit, She is taking lamotrigine  xr 300mg  qhs, has been compliant with her medication, she had two more seizure, in April 26th, and early April,     UPDATE Nov 15th 2017: She had 3 recurrent seizure, she had her thyroidectomy on Feb 21 2016, is on lamotrigine  xr 200 mg 2 tablets every night, has been complying with her medications, level in May was 9.3,    Daughter reported that her seizure is under much better control, but she still had right hand scar from burning when she had a seizure few weeks ago,   UPDATE Mar 12 2017: She is here with her daughter [name] [name], and her grandson, she had one recurrent seizure February 14 2017 after missing her lamotrigine  xr 200mg  2 tab every night, she could not tolerate depakote  ER 500mg  qhs, it made her very dizzy, she is no longer taking it   UPDATE July 15, 2018 SS: [name] [name] is a 73 year old female with history of recurrent complex partial seizures.  Her most recent seizure was in September 2018.  She is currently taking lamotrigine  200 mg extended release 2 tablets every night.  She is tolerating the medication well. She currently lives alone however her family is always with her.  She does not drive.  She is accompanied by her daughter at this visit.  She is able to care for herself however her daughter does prepare her medications for the week.  In 2019, patient's mother and her aunt were killed in a car accident.  She denies any new problems or concerns today.  Her daughter is very pleased with the medication and the fact that the patient has not had a seizure in over a year.  She said prior to being on lamotrigine  she would have daily  seizures.   Update July 22, 2019 SS: Here with her daughter today.  She has been doing well, has not had recurrent seizure.  She lives alone, but her family is always with her.  She does not drive.  She is tolerating lamotrigine  XR 200 mg, 2 at bedtime.  Her daughter fixes her weekly pills.  Her overall health has been good.  No new problems or concerns.  Her daughter says she does not have schizophrenia.  Sees PCP for routine labs.  Last appointment, Lamictal  level 7.9. BP 95/56, asymptomatic today.  Update Oct 12, 2020 SS: Here today for follow-up accompanied by her daughter, granddaughter.  Doing well, no recurrent seizure, remains on Lamictal  XR 200 mg, 2 tablets at bedtime.  Last seizure was in 2018.  She is able to take her pills from the pillbox.  Lives alone but family stays with her. Has been having sensation she can't sneeze, has been to ER.  In March 2021 Lamictal  level was 11.5,  Update October 24, [name]: Here with grandson, no seizures, remains on Lamictal  XR 200 mg, 2 tablets at bedtime, she takes from pill box. Lives alone, but someone always with her. Working on eating better, drinking more water, takes gabapentin for sciatica, seeing spine doctor at [facility]. No falls, but has to hold on to rails. Smokes 2 packs a day.   CBC unremarkable 10/17/21, BMP creatinine 1.20, normal liver function 08/03/21 CMP  Update December 17, 2022 SS: June 2023 Lamictal  level 7.4. here with grandson. Issues with GYN issues, no seizures. Has her own house, but her grandkids stay. Remains on Lamictal  XR 200 mg 2 pills at bedtime. Medications are overseen. Getting around fine, no falls, she doesn't drive.  Update December 17, 2023 SS:   REVIEW OF SYSTEMS: Out of a complete 14 system review of symptoms, the patient complains only of the following symptoms, and all other reviewed systems are negative.  See HPI  ALLERGIES: No Known Allergies  HOME MEDICATIONS: Outpatient Medications Prior to Visit  Medication  Sig Dispense Refill   acetaminophen  (TYLENOL ) 500 MG tablet Take 500 mg by mouth every 6 (six) hours as needed for mild pain.     albuterol  (PROVENTIL  HFA;VENTOLIN  HFA) 108 (90 Base) MCG/ACT inhaler Inhale 2 puffs into the lungs every 6 (six) hours as needed for wheezing or shortness of breath. 8 g 0   aspirin  81 MG tablet Take 81 mg by mouth daily.     atorvastatin  (LIPITOR ) 80 MG tablet Take 80 mg by mouth daily.     busPIRone  (BUSPAR ) 10 MG tablet Take 5 mg by mouth 2 (two) times daily. May take an additional 5mg  every 8 hours (Patient not taking: Reported on 12/17/2022)     diphenhydramine -acetaminophen  (TYLENOL  PM) 25-500 MG TABS tablet Take 1 tablet by mouth at bedtime as needed (sleep).     fluticasone  (FLONASE ) 50 MCG/ACT nasal spray Place 2 sprays into both nostrils daily. 16 g 0   gabapentin (NEURONTIN)  300 MG capsule Take 1 capsule by mouth at bedtime.     glipiZIDE (GLUCOTROL XL) 5 MG 24 hr tablet Take 5 mg by mouth daily.     glyBURIDE -metformin  (GLUCOVANCE) 5-500 MG per tablet Take 1 tablet by mouth 2 (two) times daily with a meal.     LamoTRIgine  200 MG TB24 24 hour tablet Take 2 tablets (400 mg total) by mouth at bedtime. 180 tablet 3   levothyroxine  (SYNTHROID , LEVOTHROID) 75 MCG tablet Take by mouth.     lisinopril  (PRINIVIL ,ZESTRIL ) 5 MG tablet Take 2.5 mg by mouth daily.     metoprolol  succinate (TOPROL -XL) 25 MG 24 hr tablet Take 0.5 tablets (12.5 mg total) by mouth daily.     nitroGLYCERIN  (NITROSTAT ) 0.4 MG SL tablet Place 0.4 mg under the tongue every 5 (five) minutes as needed for chest pain.     omeprazole (PRILOSEC) 40 MG capsule Take 40 mg by mouth every morning.     No facility-administered medications prior to visit.    PAST MEDICAL HISTORY: Past Medical History:  Diagnosis Date   Acute thrombus of left ventricle (HCC)    Epilepsy (HCC)    High cholesterol    Hypertension    Ischemic cardiomyopathy    Memory deficits    Schizo affective schizophrenia (HCC)     Seizure disorder (HCC)    Tobacco abuse    Type II diabetes mellitus (HCC)     PAST SURGICAL HISTORY: Past Surgical History:  Procedure Laterality Date   CORONARY ANGIOGRAM  04/03/2013   Procedure: CORONARY ANGIOGRAM;  Surgeon: Toribio JONELLE Fuel, MD;  Location[facility] C[facility] LAB;  Service: Cardiovascular;;   CORONARY ARTERY BYPASS GRAFT N/A 04/10/2013   Procedure: CORONARY ARTERY BYPASS GRAFTING (CABG);  Surgeon: Dorise MARLA Fellers, MD;  Location: [facility] OR;  Service: Open Heart Surgery;  Laterality: N/A;  Times 4 using left internal mammary artery and endoscopically harvested right saphenous vein.   INTRAOPERATIVE TRANSESOPHAGEAL ECHOCARDIOGRAM N/A 04/10/2013   Procedure: INTRAOPERATIVE TRANSESOPHAGEAL ECHOCARDIOGRAM;  Surgeon: Dorise MARLA Fellers, MD;  Location: [facility] OR;  Service: Open Heart Surgery;  Laterality: N/A;   RIGHT HEART CATHETERIZATION  04/03/2013   Procedure: RIGHT HEART CATH;  Surgeon: Toribio JONELLE Fuel, MD;  Location: [facility] CATH LAB;  Service: Cardiovascular;;   THYROIDECTOMY  02/21/2016    FAMILY HISTORY: Family History  Problem Relation Age of Onset   Thyroid disease Mother    Liver disease Father    Seizures Brother    Heart disease Sister     SOCIAL HISTORY: Social History   Socioeconomic History   Marital status: Single    Spouse name: Not on file   Number of children: 3   Years of education: 11   Highest education level: Not on file  Occupational History   Occupation: Unemployed  Tobacco Use   Smoking status: Every Day    Current packs/day: 0.00    Types: Cigarettes    Last attempt to quit: 04/19/2013    Years since quitting: 10.6   Smokeless tobacco: Never  Substance and Sexual Activity   Alcohol use: No    Alcohol/week: 0.0 standard drinks of alcohol   Drug use: No   Sexual activity: Not on file  Other Topics Concern   Not on file  Social History Narrative   Lives at home alone.   Right-handed.   Approximately two 2 liters per day of soda.   Social  Drivers of Health   Financial Resource Strain: Low Risk  (01/09/2022)  [facility]   Overall Financial Resource Strain (CARDIA)    Difficulty of Paying Living Expenses: Not hard at all  Food Insecurity: No Food Insecurity (04/22/2023)   Received [facility]   Hunger Vital Sign    Within the past 12 months, you worried that your food would run out before you got the money to buy more.: Never true    Within the past 12 months, the food you bought just didn't last and you didn't have money to get more.: Never true  Transportation Needs: No Transportation Needs (04/22/2023)   Received from [facility] - Transportation    Lack of Transportation (Medical): No    Lack of Transportation (Non-Medical): No  Physical Activity: Insufficiently Active (01/09/2022)   Received from [facility]   Exercise Vital Sign    On average, how many days per week do you engage in moderate to strenuous exercise (like a brisk walk)?: 2 days    On average, how many minutes do you engage in exercise at this level?: 50 min  Stress: Stress Concern Present (01/09/2022)   Received from [facility] - Occupational Stress Questionnaire    Feeling of Stress : To some extent  Social Connections: Moderately Integrated (01/09/2022)   Received from [facility]   Social Connection and Isolation Panel    In a typical week, how many times do you talk on the phone with family, friends, or neighbors?: More than three times a week    How often do you get together with friends or relatives?: More than three times a week    How often do you attend church or religious services?: More than 4 times per year    Do you belong to any clubs or organizations such as church groups, unions, fraternal or athletic groups, or school groups?: Yes    How often do you attend meetings of the clubs or organizations you belong to?: More than 4 times per year     Are you married, widowed, divorced, separated, never married, or living with a partner?: Never married  Intimate Partner Violence: Not At Risk (01/09/2022)   Received from [facility]   Humiliation, Afraid, Rape, and Kick questionnaire    Within the last year, have you been afraid of your partner or ex-partner?: No    Within the last year, have you been humiliated or emotionally abused in other ways by your partner or ex-partner?: No    Within the last year, have you been kicked, hit, slapped, or otherwise physically hurt by your partner or ex-partner?: No    Within the last year, have you been raped or forced to have any kind of sexual activity by your partner or ex-partner?: No   PHYSICAL EXAM  There were no vitals filed for this visit.  There is no height or weight on file to calculate BMI.  Generalized: Well developed, in no acute distress Neurological examination  Mentation: Alert oriented to time, place. Follows all commands speech and language fluent.  Most of history is provided by grandson, flat affect Cranial nerve II-XII: Pupils were equal round reactive to light. Extraocular movements were full, visual field were full on confrontational test. Facial sensation and strength were normal. Head turning and shoulder shrug were normal and symmetric. Motor: Good strength of all extremities Sensory: Sensory testing is intact to soft touch on all 4 extremities. No evidence  of extinction is noted.  Coordination: Cerebellar testing reveals good finger-nose-finger and heel-to-shin bilaterally. Mild difficulty understanding exam commands Gait and station:  gait is normal, no assistive device, steady Reflexes: Deep tendon reflexes are symmetric, 2+  DIAGNOSTIC DATA (LABS, IMAGING, TESTING) - I reviewed patient records, labs, notes, testing and imaging myself where available.  Lab Results  Component Value Date   WBC 5.2 07/16/2016   HGB 11.8 (L) 07/16/2016   HCT 36.0 07/16/2016   MCV  96.0 07/16/2016   PLT 289 07/16/2016      Component Value Date/Time   NA 138 07/16/2016 1216   NA 140 10/21/2014 1009   K 4.2 07/16/2016 1216   CL 105 07/16/2016 1216   CO2 23 07/16/2016 1216   GLUCOSE 167 (H) 07/16/2016 1216   BUN <5 (L) 07/16/2016 1216   BUN 10 10/21/2014 1009   CREATININE 0.85 07/16/2016 1216   CALCIUM  9.4 07/16/2016 1216   PROT 7.1 10/21/2014 1009   ALBUMIN  4.3 10/21/2014 1009   AST 27 10/21/2014 1009   ALT 28 10/21/2014 1009   ALKPHOS 142 (H) 10/21/2014 1009   BILITOT 0.2 10/21/2014 1009   GFRNONAA >60 07/16/2016 1216   GFRAA >60 07/16/2016 1216   Lab Results  Component Value Date   CHOL 107 07/16/2016   HDL 34 (L) 07/16/2016   LDLCALC 53 07/16/2016   TRIG 101 07/16/2016   CHOLHDL 3.1 07/16/2016   Lab Results  Component Value Date   HGBA1C 11.9 (H) 04/03/2013   No results found for: [id number] Lab Results  Component Value Date   TSH 0.583 10/21/2014   ASSESSMENT AND PLAN 73 y.o. year old female :  1.  Complex partial seizure -No recent seizures, last was in September 2018 -Continue lamotrigine  XR 200 mg, 2 tablets at bedtime -Call for recurrent seizure, otherwise follow-up 1 year or sooner if needed  Lauraine Born, SCHARLENE, DNP 12/17/2023, 5:29 AM [facility] [address] [place], [place] [address] [phone]

## 2024-01-01 ENCOUNTER — Other Ambulatory Visit: Payer: Self-pay | Admitting: Neurology

## 2024-01-28 ENCOUNTER — Other Ambulatory Visit: Payer: Self-pay | Admitting: Neurology

## 2024-03-02 ENCOUNTER — Other Ambulatory Visit: Payer: Self-pay | Admitting: Neurology

## 2024-03-18 ENCOUNTER — Telehealth: Payer: Self-pay | Admitting: Neurology

## 2024-03-18 NOTE — Telephone Encounter (Signed)
 Appointment details confirmed for January'2026

## 2024-05-28 ENCOUNTER — Encounter: Payer: Self-pay | Admitting: Neurology

## 2024-05-28 ENCOUNTER — Ambulatory Visit: Admitting: Neurology

## 2024-05-28 VITALS — BP 105/62 | HR 64 | Ht 63.0 in | Wt 140.2 lb

## 2024-05-28 DIAGNOSIS — G40909 Epilepsy, unspecified, not intractable, without status epilepticus: Secondary | ICD-10-CM

## 2024-05-28 MED ORDER — LAMOTRIGINE ER 200 MG PO TB24: 2.0000 | ORAL_TABLET | Freq: Every day | ORAL | 4 refills | Status: AC

## 2024-05-28 NOTE — Progress Notes (Signed)
 "   ASSESSMENT AND PLAN 74 y.o. year old female :   Complex partial seizure -No recent seizures since lamotrigine  XR 200 mg 2 tablets at bedtime, last was in September 2018 - Refilled her prescription  Continue to refill by her primary care or  return in 1 year if she would continue refill through  our office     HISTORY : [name] [name] is a 74 years old right-handed female, accompanied by her daughter [name] [name], referred by her primary care nurse practitioner Rollene Fish for evaluation of seizure   She had a history of schizophrenia, hypertension, hyperlipidemia, type 2 diabetes, coronary artery disease, status post bypass surgery smoker   She started to have seizure around 1986, increased occurrence since 2000. She has average 1-3 seizure in one week, staring spells, drop her head, making noises, woke up confused, sometimes burn her face with cigarette stub, each episode lasts less than 5 minutes   Over the past few years, she has been taking Dilantin  300 mg every night, extra 30 mg tablet every Monday, Wednesday, Friday.   She denies gait difficulty, watches TV all day long,   I have reviewed MRI cardiac morphology in Nov 2014, left ventricle has normal size, with severely decreased ejection fraction 20-25%, hypokinesia is in the mid anterior and anteroseptal walls. There is dyskinesis of the all apical four segments and in the true apex with a thrombus in the apex measuring 28 x 20 x 18 mm. There is a transmural scar in the apical septal and inferior walls and in the true apex. There is nontransmural scar in the apical anterior, mid antero and inferoseptal walls with good prognosis if revascularized. Right ventricle has normal size and function.There is mild mitral regurgitation and mild left atrial enlargement.   UPDATE July 26th 2016: She is with her daughter [name] [name] today, MRI brain, EEG pending. She had 4 seizures in one week during transition from Dilantin  to lamotrigine ,  Lamotrigine  100mg  bid works well for her, no significant side effect from lamotrigine , she only has one seizure in July 24 while taking lamotrigine  100 mg twice a day, daughter concerned that compliance is the issue   UPDATE Mar 30 2015: I have reviewed MRI of the brain with without contrast, no significant abnormality, EEG was normal.   She has no recurrent seizure, currently taking lamotrigine  xr 300 mg every night    UPDATE May 10th 2017: She is with her daughter at today's clinical visit, She is taking lamotrigine  xr 300mg  qhs, has been compliant with her medication, she had two more seizure, in April 26th, and early April,     UPDATE Nov 15th 2017: She had 3 recurrent seizure, she had her thyroidectomy on Feb 21 2016, is on lamotrigine  xr 200 mg 2 tablets every night, has been complying with her medications, level in May was 9.3,    Daughter reported that her seizure is under much better control, but she still had right hand scar from burning when she had a seizure few weeks ago,   UPDATE Mar 12 2017: She is here with her daughter [name] [name], and her grandson, she had one recurrent seizure February 14 2017 after missing her lamotrigine  xr 200mg  2 tab every night, she could not tolerate depakote  ER 500mg  qhs, it made her very dizzy, she is no longer taking it  UPDATE Jan 8th 2026: She was brought in by her grandson alone at visit, since she compliant with her lamotrigine  XR 200 mg 2 tablets every  night, she did very well, had no recurrent seizure, the reported most recent seizure was in September 2018,  She lives alone, has a caregiver with her every night  Laboratory in December 2025 hemoglobin of 10.4, creatinine mildly elevated 1.4 GFR of 40   PHYSICAL EXAMNIATION:     05/28/2024    9:06 AM 12/17/2022    8:33 AM 10/24/2021    2:40 PM  Vitals with BMI  Height 5' 3 5' 3 5' 3  Weight 140 lbs 3 oz 145 lbs 8 oz 133 lbs 8 oz  BMI 24.84 25.78 23.65  Systolic 105 118 874  Diastolic 62  70 65  Pulse 64 58 52     Gen: NAD, conversant, well nourised, well groomed                     Cardiovascular: Regular rate rhythm, no peripheral edema, warm, nontender. Eyes: Conjunctivae clear without exudates or hemorrhage Neck: Supple, no carotid bruits. Pulmonary: Clear to auscultation bilaterally   NEUROLOGICAL EXAM:  MENTAL STATUS: Speech/cognition: Awake, alert oriented to history taking and casual conversation  CRANIAL NERVES: CN II: Visual fields are full to confrontation.  Pupils are round equal and briskly reactive to light. CN III, IV, VI: extraocular movement are normal. No ptosis. CN V: Facial sensation is intact to pinprick in all 3 divisions bilaterally. Corneal responses are intact.  CN VII: Face is symmetric with normal eye closure and smile. CN VIII: Hearing is normal to casual conversation CN IX, X: Palate elevates symmetrically. Phonation is normal. CN XI: Head turning and shoulder shrug are intact CN XII: Tongue is midline with normal movements and no atrophy.  MOTOR: There is no pronator drift of out-stretched arms. Muscle bulk and tone are normal. Muscle strength is normal.  REFLEXES: Reflexes are 1 and symmetric at the biceps, triceps, knees, and ankles. Plantar responses are flexor.  SENSORY: Intact to light touch, pinprick, positional and vibratory sensation are intact in fingers and toes.  COORDINATION: Rapid alternating movements and fine finger movements are intact. There is no dysmetria on finger-to-nose and heel-knee-shin.    GAIT/STANCE: Posture is normal. Gait is steady    REVIEW OF SYSTEMS: Out of a complete 14 system review of symptoms, the patient complains only of the following symptoms, and all other reviewed systems are negative.  See HPI  ALLERGIES: No Known Allergies  HOME MEDICATIONS: Outpatient Medications Prior to Visit  Medication Sig Dispense Refill   acetaminophen  (TYLENOL ) 500 MG tablet Take 500 mg by mouth every 6  (six) hours as needed for mild pain.     albuterol  (PROVENTIL  HFA;VENTOLIN  HFA) 108 (90 Base) MCG/ACT inhaler Inhale 2 puffs into the lungs every 6 (six) hours as needed for wheezing or shortness of breath. 8 g 0   aspirin  81 MG tablet Take 81 mg by mouth daily.     atorvastatin  (LIPITOR ) 80 MG tablet Take 80 mg by mouth daily.     busPIRone  (BUSPAR ) 10 MG tablet Take 5 mg by mouth 2 (two) times daily. May take an additional 5mg  every 8 hours     diphenhydramine -acetaminophen  (TYLENOL  PM) 25-500 MG TABS tablet Take 1 tablet by mouth at bedtime as needed (sleep).     fluticasone  (FLONASE ) 50 MCG/ACT nasal spray Place 2 sprays into both nostrils daily. 16 g 0   gabapentin (NEURONTIN) 300 MG capsule Take 1 capsule by mouth at bedtime.     glipiZIDE (GLUCOTROL XL) 5 MG 24 hr tablet Take  5 mg by mouth daily.     glyBURIDE -metformin  (GLUCOVANCE) 5-500 MG per tablet Take 1 tablet by mouth 2 (two) times daily with a meal.     LamoTRIgine  200 MG TB24 24 hour tablet TAKE 2 TABLETS BY MOUTH AT BEDTIME 180 tablet 1   levothyroxine  (SYNTHROID , LEVOTHROID) 75 MCG tablet Take by mouth.     lisinopril  (PRINIVIL ,ZESTRIL ) 5 MG tablet Take 2.5 mg by mouth daily.     metoprolol  succinate (TOPROL -XL) 25 MG 24 hr tablet Take 0.5 tablets (12.5 mg total) by mouth daily.     nitroGLYCERIN  (NITROSTAT ) 0.4 MG SL tablet Place 0.4 mg under the tongue every 5 (five) minutes as needed for chest pain.     omeprazole (PRILOSEC) 40 MG capsule Take 40 mg by mouth every morning.     No facility-administered medications prior to visit.    PAST MEDICAL HISTORY: Past Medical History:  Diagnosis Date   Acute thrombus of left ventricle (HCC)    Epilepsy (HCC)    High cholesterol    Hypertension    Ischemic cardiomyopathy    Memory deficits    Schizo affective schizophrenia (HCC)    Seizure disorder (HCC)    Tobacco abuse    Type II diabetes mellitus (HCC)     PAST SURGICAL HISTORY: Past Surgical History:  Procedure  Laterality Date   CORONARY ANGIOGRAM  04/03/2013   Procedure: CORONARY ANGIOGRAM;  Surgeon: Toribio JONELLE Fuel, MD;  Location: [facility] CATH LAB;  Service: Cardiovascular;;   CORONARY ARTERY BYPASS GRAFT N/A 04/10/2013   Procedure: CORONARY ARTERY BYPASS GRAFTING (CABG);  Surgeon: Dorise MARLA Fellers, MD;  Location: [facility] OR;  Service: Open Heart Surgery;  Laterality: N/A;  Times 4 using left internal mammary artery and endoscopically harvested right saphenous vein.   INTRAOPERATIVE TRANSESOPHAGEAL ECHOCARDIOGRAM N/A 04/10/2013   Procedure: INTRAOPERATIVE TRANSESOPHAGEAL ECHOCARDIOGRAM;  Surgeon: Dorise MARLA Fellers, MD;  Location: [facility] OR;  Service: Open Heart Surgery;  Laterality: N/A;   RIGHT HEART CATHETERIZATION  04/03/2013   Procedure: RIGHT HEART CATH;  Surgeon: Toribio JONELLE Fuel, MD;  Location: [facility] CATH LAB;  Service: Cardiovascular;;   THYROIDECTOMY  02/21/2016    FAMILY HISTORY: Family History  Problem Relation Age of Onset   Thyroid disease Mother    Liver disease Father    Seizures Brother    Heart disease Sister     SOCIAL HISTORY: Social History   Socioeconomic History   Marital status: Single    Spouse name: Not on file   Number of children: 3   Years of education: 11   Highest education level: Not on file  Occupational History   Occupation: Unemployed  Tobacco Use   Smoking status: Every Day    Current packs/day: 0.00    Average packs/day: 2.0 packs/day    Types: Cigarettes    Last attempt to quit: 04/19/2013    Years since quitting: 11.1   Smokeless tobacco: Never  Vaping Use   Vaping status: Never Used  Substance and Sexual Activity   Alcohol use: No    Alcohol/week: 0.0 standard drinks of alcohol   Drug use: No   Sexual activity: Not on file  Other Topics Concern   Not on file  Social History Narrative   Lives at home alone.   Right-handed.   Approximately two 2 liters per day of soda.   Social Drivers of Health   Tobacco Use: High Risk (05/28/2024)   Patient  History    Smoking Tobacco Use: Every Day  Smokeless Tobacco Use: Never    Passive Exposure: Not on file  Financial Resource Strain: Low Risk (01/09/2022)   Received from [facility]   Overall Financial Resource Strain (CARDIA)    Difficulty of Paying Living Expenses: Not hard at all  Food Insecurity: No Food Insecurity (04/22/2023)   Received from [facility]   Epic    Within the past 12 months, you worried that your food would run out before you got the money to buy more.: Never true    Within the past 12 months, the food you bought just didn't last and you didn't have money to get more.: Never true  Transportation Needs: No Transportation Needs (04/22/2023)   Received from [facility]   PRAPARE - Transportation    Lack of Transportation (Medical): No    Lack of Transportation (Non-Medical): No  Physical Activity: Insufficiently Active (01/09/2022)   Received from [facility]   Exercise Vital Sign    On average, how many days per week do you engage in moderate to strenuous exercise (like a brisk walk)?: 2 days    On average, how many minutes do you engage in exercise at this level?: 50 min  Stress: Stress Concern Present (01/09/2022)   Received from [facility] - Occupational Stress Questionnaire    Feeling of Stress : To some extent  Social Connections: Moderately Integrated (01/09/2022)   Received from [facility]   Social Connection and Isolation Panel    In a typical week, how many times do you talk on the phone with family, friends, or neighbors?: More than three times a week    How often do you get together with friends or relatives?: More than three times a week    How often do you attend church or religious services?: More than 4 times per year    Do you belong to any clubs or organizations such as church groups, unions, fraternal or athletic groups, or school groups?: Yes    How often do you attend meetings of  the clubs or organizations you belong to?: More than 4 times per year    Are you married, widowed, divorced, separated, never married, or living with a partner?: Never married  Intimate Partner Violence: Not At Risk (01/09/2022)   Received from [facility]   Epic    Within the last year, have you been afraid of your partner or ex-partner?: No    Within the last year, have you been humiliated or emotionally abused in other ways by your partner or ex-partner?: No    Within the last year, have you been kicked, hit, slapped, or otherwise physically hurt by your partner or ex-partner?: No    Within the last year, have you been raped or forced to have any kind of sexual activity by your partner or ex-partner?: No  Depression (PHQ2-9): Not on file  Alcohol Screen: Not on file  Housing: Not on file  Utilities: Low Risk (04/22/2023)   Received from [facility]   Utilities    Within the past 12 months, have you been unable to get utilities(heat, electricity) when it was really needed?: No  Health Literacy: Low Risk (01/09/2022)   Received from [facility] Literacy    How often do you need to have someone help you when you read instructions, pamphlets, or other written material from your doctor or pharmacy?: Never  PHYSICAL EXAM  Vitals:   05/28/24 0906  BP: 105/62  Pulse: 64  SpO2: 98%  Weight: 140 lb 3.2 oz (63.6 kg)  Height: 5' 3 (1.6 m)   Body mass index is 24.84 kg/m.  Generalized: Well developed, in no acute distress Neurological examination  Mentation: Alert oriented to time, place. Follows all commands speech and language fluent.  Most of history is provided by grandson, flat affect Cranial nerve II-XII: Pupils were equal round reactive to light. Extraocular movements were full, visual field were full on confrontational test. Facial sensation and strength were normal. Head turning and shoulder shrug were normal and symmetric. Motor: Good strength of all  extremities Sensory: Sensory testing is intact to soft touch on all 4 extremities. No evidence of extinction is noted.  Coordination: Cerebellar testing reveals good finger-nose-finger and heel-to-shin bilaterally. Mild difficulty understanding exam commands Gait and station:  gait is normal, no assistive device, steady Reflexes: Deep tendon reflexes are symmetric, 2+  DIAGNOSTIC DATA (LABS, IMAGING, TESTING) - I reviewed patient records, labs, notes, testing and imaging myself where available.  Lab Results  Component Value Date   WBC 5.2 07/16/2016   HGB 11.8 (L) 07/16/2016   HCT 36.0 07/16/2016   MCV 96.0 07/16/2016   PLT 289 07/16/2016      Component Value Date/Time   NA 138 07/16/2016 1216   NA 140 10/21/2014 1009   K 4.2 07/16/2016 1216   CL 105 07/16/2016 1216   CO2 23 07/16/2016 1216   GLUCOSE 167 (H) 07/16/2016 1216   BUN <5 (L) 07/16/2016 1216   BUN 10 10/21/2014 1009   CREATININE 0.85 07/16/2016 1216   CALCIUM  9.4 07/16/2016 1216   PROT 7.1 10/21/2014 1009   ALBUMIN  4.3 10/21/2014 1009   AST 27 10/21/2014 1009   ALT 28 10/21/2014 1009   ALKPHOS 142 (H) 10/21/2014 1009   BILITOT 0.2 10/21/2014 1009   GFRNONAA >60 07/16/2016 1216   GFRAA >60 07/16/2016 1216   Lab Results  Component Value Date   CHOL 107 07/16/2016   HDL 34 (L) 07/16/2016   LDLCALC 53 07/16/2016   TRIG 101 07/16/2016   CHOLHDL 3.1 07/16/2016   Lab Results  Component Value Date   HGBA1C 11.9 (H) 04/03/2013   No results found for: VITAMINB12 Lab Results  Component Value Date   TSH 0.583 10/21/2014     "

## 2025-05-31 ENCOUNTER — Ambulatory Visit: Admitting: Neurology

## 2025-06-03 ENCOUNTER — Ambulatory Visit: Admitting: Neurology
